# Patient Record
Sex: Male | Born: 1965 | Race: White | Hispanic: No | Marital: Married | Smoking: Never smoker
Health system: Southern US, Community
[De-identification: ages and names within clinical notes are randomized; demographics above are authoritative.]

## PROBLEM LIST (undated history)

## (undated) DIAGNOSIS — Q282 Arteriovenous malformation of cerebral vessels: Secondary | ICD-10-CM

## (undated) DIAGNOSIS — G43909 Migraine, unspecified, not intractable, without status migrainosus: Secondary | ICD-10-CM

## (undated) DIAGNOSIS — G40309 Generalized idiopathic epilepsy and epileptic syndromes, not intractable, without status epilepticus: Secondary | ICD-10-CM

## (undated) DIAGNOSIS — Z0000 Encounter for general adult medical examination without abnormal findings: Principal | ICD-10-CM

## (undated) DIAGNOSIS — R569 Unspecified convulsions: Secondary | ICD-10-CM

## (undated) HISTORY — DX: Arteriovenous malformation of cerebral vessels: Q28.2

## (undated) HISTORY — DX: Encounter for general adult medical examination without abnormal findings: Z00.00

## (undated) HISTORY — DX: Migraine, unspecified, not intractable, without status migrainosus: G43.909

## (undated) HISTORY — DX: Unspecified convulsions: R56.9

## (undated) HISTORY — DX: Generalized idiopathic epilepsy and epileptic syndromes, not intractable, without status epilepticus: G40.309

---

## 2001-01-08 ENCOUNTER — Encounter: Payer: Self-pay | Admitting: Family Medicine

## 2001-01-08 ENCOUNTER — Encounter: Admission: RE | Admit: 2001-01-08 | Discharge: 2001-01-08 | Payer: Self-pay | Admitting: Family Medicine

## 2001-01-14 ENCOUNTER — Ambulatory Visit (HOSPITAL_COMMUNITY): Admission: RE | Admit: 2001-01-14 | Discharge: 2001-01-14 | Payer: Self-pay | Admitting: Neurological Surgery

## 2001-01-14 ENCOUNTER — Encounter: Payer: Self-pay | Admitting: Neurological Surgery

## 2001-01-16 ENCOUNTER — Ambulatory Visit (HOSPITAL_COMMUNITY): Admission: RE | Admit: 2001-01-16 | Discharge: 2001-01-16 | Payer: Self-pay | Admitting: Neurological Surgery

## 2001-01-21 ENCOUNTER — Ambulatory Visit (HOSPITAL_COMMUNITY): Admission: RE | Admit: 2001-01-21 | Discharge: 2001-01-21 | Payer: Self-pay | Admitting: Neurological Surgery

## 2001-01-21 ENCOUNTER — Encounter: Payer: Self-pay | Admitting: Neurological Surgery

## 2006-02-12 ENCOUNTER — Emergency Department (HOSPITAL_COMMUNITY): Admission: EM | Admit: 2006-02-12 | Discharge: 2006-02-12 | Payer: Self-pay | Admitting: Emergency Medicine

## 2007-02-12 ENCOUNTER — Encounter: Admission: RE | Admit: 2007-02-12 | Discharge: 2007-02-12 | Payer: Self-pay | Admitting: Unknown Physician Specialty

## 2013-03-19 ENCOUNTER — Other Ambulatory Visit: Payer: Self-pay | Admitting: Neurology

## 2013-03-24 ENCOUNTER — Telehealth: Payer: Self-pay | Admitting: Neurology

## 2013-03-24 NOTE — Telephone Encounter (Signed)
Patient has not been seen since 2012.  He will need to schedule an appt.  I spoke with patient, he is aware.

## 2013-03-25 ENCOUNTER — Telehealth: Payer: Self-pay | Admitting: Neurology

## 2013-03-25 MED ORDER — LAMICTAL 200 MG PO TABS: 200.0000 mg | ORAL_TABLET | Freq: Two times a day (BID) | ORAL | Status: DC

## 2013-03-25 MED ORDER — LEVETIRACETAM 750 MG PO TABS: 750.0000 mg | ORAL_TABLET | Freq: Two times a day (BID) | ORAL | Status: DC

## 2013-03-25 MED ORDER — LAMICTAL 150 MG PO TABS: 150.0000 mg | ORAL_TABLET | Freq: Two times a day (BID) | ORAL | Status: DC

## 2013-03-25 NOTE — Telephone Encounter (Signed)
Called pt. Returned call to number left, unable to accept calls at this time. Tried alternate numbers. Unsuccessful.

## 2013-03-25 NOTE — Telephone Encounter (Signed)
Patient is calling to let us know he is out of his medication.  Please call back today if at all possible.  He has left more than one message, per patient.

## 2013-03-25 NOTE — Telephone Encounter (Signed)
I spoke with patient.  He would like Rx's sent to [facility].

## 2013-10-01 ENCOUNTER — Encounter (INDEPENDENT_AMBULATORY_CARE_PROVIDER_SITE_OTHER): Payer: Self-pay

## 2013-10-01 ENCOUNTER — Ambulatory Visit (INDEPENDENT_AMBULATORY_CARE_PROVIDER_SITE_OTHER): Payer: Managed Care, Other (non HMO) | Admitting: Neurology

## 2013-10-01 ENCOUNTER — Encounter: Payer: Self-pay | Admitting: Neurology

## 2013-10-01 VITALS — BP 136/69 | HR 67 | Wt 187.5 lb

## 2013-10-01 DIAGNOSIS — Q282 Arteriovenous malformation of cerebral vessels: Secondary | ICD-10-CM

## 2013-10-01 DIAGNOSIS — Z5181 Encounter for therapeutic drug level monitoring: Secondary | ICD-10-CM

## 2013-10-01 DIAGNOSIS — Q283 Other malformations of cerebral vessels: Secondary | ICD-10-CM

## 2013-10-01 DIAGNOSIS — G40309 Generalized idiopathic epilepsy and epileptic syndromes, not intractable, without status epilepticus: Secondary | ICD-10-CM | POA: Insufficient documentation

## 2013-10-01 HISTORY — DX: Generalized idiopathic epilepsy and epileptic syndromes, not intractable, without status epilepticus: G40.309

## 2013-10-01 MED ORDER — LAMICTAL 150 MG PO TABS: 150.0000 mg | ORAL_TABLET | Freq: Two times a day (BID) | ORAL | Status: DC

## 2013-10-01 MED ORDER — LEVETIRACETAM 750 MG PO TABS: 750.0000 mg | ORAL_TABLET | Freq: Two times a day (BID) | ORAL | Status: DC

## 2013-10-01 MED ORDER — LAMICTAL 200 MG PO TABS: 200.0000 mg | ORAL_TABLET | Freq: Two times a day (BID) | ORAL | Status: DC

## 2013-10-01 MED ORDER — RIZATRIPTAN BENZOATE 10 MG PO TBDP: 10.0000 mg | ORAL_TABLET | Freq: Three times a day (TID) | ORAL | Status: DC | PRN

## 2013-10-01 NOTE — Patient Instructions (Signed)
Epilepsy A seizure (convulsion) is a sudden change in brain function that causes a change in behavior, muscle activity, or ability to remain awake and alert. If a person has recurring seizures, this is called epilepsy. CAUSES  Epilepsy is a disorder with many possible causes. Anything that disturbs the normal pattern of brain cell activity can lead to seizures. Seizure can be caused from illness to brain damage to abnormal brain development. Epilepsy may develop because of:  An abnormality in brain wiring.  An imbalance of nerve signaling chemicals (neurotransmitters).  Some combination of these factors. Scientists are learning an increasing amount about genetic causes of seizures. SYMPTOMS  The symptoms of a seizure can vary greatly from one person to another. These may include:  An aura, or warning that tells a person they are about to have a seizure.  Abnormal sensations, such as abnormal smell or seeing flashing lights.  Sudden, general body stiffness.  Rhythmic jerking of the face, arm, or leg  on one or both sides.  Sudden change in consciousness.  The person may appear to be awake but not responding.  They may appear to be asleep but cannot be awakened.  Grimacing, chewing, lip smacking, or drooling.  Often there is a period of sleepiness after a seizure. DIAGNOSIS  The description you give to your caregiver about what you experienced will help them understand your problems. Equally important is the description by any witnesses to your seizure. A physical exam, including a detailed neurological exam, is necessary. An EEG (electroencephalogram) is a painless test of your brain waves. In this test a diagram is created of your brain waves. These diagrams can be interpreted by a specialist. Pictures of your brain are usually taken with:  An MRI.  A CT scan. Lab tests may be done to look for:  Signs of infection.  Abnormal blood chemistry. PREVENTION  There is no way to  prevent the development of epilepsy. If you have seizures that are typically triggered by an event (such as flashing lights), try to avoid the trigger. This can help you avoid a seizure.  PROGNOSIS  Most people with epilepsy lead outwardly normal lives. While epilepsy cannot currently be cured, for some people it does eventually go away. Most seizures do not cause brain damage. It is not uncommon for people with epilepsy, especially children, to develop behavioral and emotional problems. These problems are sometimes the consequence of medicine for seizures or social stress. For some people with epilepsy, the risk of seizures restricts their independence and recreational activities. For example, some states refuse drivers licenses to people with epilepsy. Most women with epilepsy can become pregnant. They should discuss their epilepsy and the medicine they are taking with their caregivers. Women with epilepsy have a 90 percent or better chance of having a normal, healthy baby. RISKS AND COMPLICATIONS  People with epilepsy are at increased risk of falls, accidents, and injuries. People with epilepsy are at special risk for two life-threatening conditions. These are status epilepticus and sudden unexplained death (extremely rare). Status epilepticus is a long lasting, continuous seizure that is a medical emergency. TREATMENT  Once epilepsy is diagnosed, it is important to begin treatment as soon as possible. For about 80 percent of those diagnosed with epilepsy, seizures can be controlled with modern medicines and surgical techniques. Some antiepileptic drugs can interfere with the effectiveness of oral contraceptives. In 1997, the FDA approved a pacemaker for the brain the (vagus nerve stimulator). This stimulator can be used for   people with seizures that are not well-controlled by medicine. Studies have shown that in some cases, children may experience fewer seizures if they maintain a strict diet. The strict  diet is called the ketogenic diet. This diet is rich in fats and low in carbohydrates. HOME CARE INSTRUCTIONS   Your caregiver will make recommendations about driving and safety in normal activities. Follow these carefully.  Take any medicine prescribed exactly as directed.  Do any blood tests requested to monitor the levels of your medicine.  The people you live and work with should know that you are prone to seizures. They should receive instructions on how to help you. In general, a witness to a seizure should:  Cushion your head and body.  Turn you on your side.  Avoid unnecessarily restraining you.  Not place anything inside your mouth.  Call for local emergency medical help if there is any question about what has occurred.  Keep a seizure diary. Record what you recall about any seizure, especially any possible trigger.  If your caregiver has given you a follow-up appointment, it is very important to keep that appointment. Not keeping the appointment could result in permanent injury and disability. If there is any problem keeping the appointment, you must call back to this facility for assistance. SEEK MEDICAL CARE IF:   You develop signs of infection or other illness. This might increase the risk of a seizure.  You seem to be having more frequent seizures.  Your seizure pattern is changing. SEEK IMMEDIATE MEDICAL CARE IF:   A seizure does not stop after a few moments.  A seizure causes any difficulty in breathing.  A seizure results in a very severe headache.  A seizure leaves you with the inability to speak or use a part of your body. MAKE SURE YOU:   Understand these instructions.  Will watch your condition.  Will get help right away if you are not doing well or get worse. Document Released: 10/02/2005 Document Revised: 12/25/2011 Document Reviewed: 05/14/2013 Exit[employer] Patient Information 2014 [employer].  

## 2013-10-01 NOTE — Progress Notes (Signed)
Reason for visit: Seizures  Cristian [name] is an 47 y.o. 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 This has resulted in a seizure disorder, but the patient is well controlled on Lamictal and Keppra combination. Since being on Keppra, the patient has noted significant muscle soreness after exercise. The patient however, is able to maintain a fairly good level of physical activity. The patient is operating motor vehicle, and he continues to work. The patient has been seen previously by Dr. Danielle Dess for the AVM. The last CT scan of the brain was done in 2007. The patient still not to be a surgical candidate, and no other therapies have been done for the AVM. The patient returns to this office for an evaluation. The patient reports no new numbness, weakness, vision changes, or memory problems. The patient does have a history of migraine headaches that occur once every 2 or 3 months. The patient takes Maxalt for this with good benefit.   Past Medical History  Diagnosis Date  . Seizure   . Cerebral AVM     Right occipito-temporal  . Migraine   . 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 Rotator cuff repair Left     Family History  Problem Relation Age of Onset  . Seizures Neg Hx     Social history:  reports that he has never smoked. He has never used smokeless tobacco. He reports that he drinks alcohol. He reports that he does not use illicit drugs.   No Known Allergies  Medications:  No current outpatient prescriptions on file prior to visit.   No current facility-administered medications on file prior to visit.    ROS:  Out of a complete 14 system review of symptoms, the patient complains only of the following symptoms, and all other reviewed systems  are negative.  Neck pain Headache Seizures  Blood pressure 136/69, pulse 67, weight 187 lb 8 oz (85.049 kg).  Physical Exam  General: The patient is alert and cooperative at the time of the examination.  Skin: No significant peripheral edema is noted.   Neurologic Exam  Mental status: The patient is oriented x 3.  Cranial nerves: Facial symmetry is present. Speech is normal, no aphasia or dysarthria is noted. Extraocular movements are full. Visual fields are full.  Motor: The patient has good strength in all 4 extremities.  Sensory examination: Soft touch sensation on the face, arms, and legs is symmetric.  Coordination: The patient has good finger-nose-finger and heel-to-shin bilaterally.  Gait and station: The patient has a normal gait. Tandem gait is normal. Romberg is negative. No drift is seen.  Reflexes: Deep tendon reflexes are symmetric.   Assessment/Plan:  One. History seizures  2. Right occipital and temporal AVM  3. Migraine headache  The patient has not had an evaluation for his AVM several years, the last CT scan of the head was done in 2007. I will repeat a CT angiogram of the head to look at the AVM. The patient was given prescriptions for his medications, and blood work will be done today. The patient does not have a primary care physician. The patient will followup in one year.  Marlan Palau MD 10/01/2013 8:43 AM  [facility] [address] [place], [place] [address]  Phone [phone] Fax [phone]  

## 2013-10-02 ENCOUNTER — Telehealth: Payer: Self-pay | Admitting: Neurology

## 2013-10-02 NOTE — Telephone Encounter (Signed)
I called back.  Verified Rx with [facility].  They will process order.

## 2013-10-03 ENCOUNTER — Telehealth: Payer: Self-pay | Admitting: *Deleted

## 2013-10-03 LAB — COMPREHENSIVE METABOLIC PANEL
ALT: 27 IU/L (ref 0–44)
AST: 26 IU/L (ref 0–40)
Albumin/Globulin Ratio: 2.3 (ref 1.1–2.5)
Albumin: 4.9 g/dL (ref 3.5–5.5)
BUN: 13 mg/dL (ref 6–24)
CO2: 24 mmol/L (ref 18–29)
Calcium: 10 mg/dL (ref 8.7–10.2)
Chloride: 100 mmol/L (ref 97–108)
GFR calc non Af Amer: 102 mL/min/{1.73_m2} (ref >59)
Glucose: 91 mg/dL (ref 65–99)
Potassium: 4.2 mmol/L (ref 3.5–5.2)
Sodium: 143 mmol/L (ref 134–144)
Total Protein: 7 g/dL (ref 6.0–8.5)

## 2013-10-03 LAB — CBC WITH DIFFERENTIAL
Basos: 0 %
Eos: 2 %
HCT: 44.1 % (ref 37.5–51.0)
Hemoglobin: 14.7 g/dL (ref 12.6–17.7)
Lymphs: 36 %
MCHC: 33.3 g/dL (ref 31.5–35.7)
Monocytes: 9 %
Neutrophils Absolute: 2.4 10*3/uL (ref 1.4–7.0)
RBC: 4.68 x10E6/uL (ref 4.14–5.80)
WBC: 4.6 10*3/uL (ref 3.4–10.8)

## 2013-10-03 LAB — LAMOTRIGINE LEVEL: Lamotrigine Lvl: 10.8 ug/mL (ref 2.0–20.0)

## 2013-10-03 NOTE — Telephone Encounter (Signed)
Message copied by Salome Spotted on Fri Oct 03, 2013  1:11 PM ------      Message from: Stephanie Acre      Created: Fri Oct 03, 2013  7:50 AM       Please call the patient. The blood work results are unremarkable. Thank you.      Good levels of lamotrigine, no dosage adjustment needed.            ----- Message -----         From: [facility] Lab Results In Interface         Sent: 10/03/2013   5:47 AM           To: York Spaniel, MD                   ------

## 2013-10-03 NOTE — Telephone Encounter (Signed)
Called patient and relayed his results.Marland Kitchen

## 2013-10-05 ENCOUNTER — Other Ambulatory Visit: Payer: Self-pay

## 2013-10-05 MED ORDER — RIZATRIPTAN BENZOATE 10 MG PO TBDP: 10.0000 mg | ORAL_TABLET | ORAL | Status: DC | PRN

## 2013-10-27 ENCOUNTER — Ambulatory Visit
Admission: RE | Admit: 2013-10-27 | Discharge: 2013-10-27 | Disposition: A | Discharge status: Home / Self Care | Payer: 59 | Source: Ambulatory Visit | Attending: Neurology | Admitting: Neurology

## 2013-10-27 ENCOUNTER — Telehealth: Payer: Self-pay | Admitting: Neurology

## 2013-10-27 DIAGNOSIS — Q282 Arteriovenous malformation of cerebral vessels: Secondary | ICD-10-CM

## 2013-10-27 DIAGNOSIS — G40309 Generalized idiopathic epilepsy and epileptic syndromes, not intractable, without status epilepticus: Secondary | ICD-10-CM

## 2013-10-27 MED ORDER — IOHEXOL 350 MG/ML SOLN
100.0000 mL | Freq: Once | INTRAVENOUS | Status: AC | PRN
  Administered 2013-10-27: 100 mL via INTRAVENOUS

## 2013-10-27 NOTE — Telephone Encounter (Signed)
I called patient. The CT angiogram of the head shows no change from April 2007. The AVM is quite large, involving a good portion of the right hemisphere of the brain.

## 2014-05-13 ENCOUNTER — Encounter: Payer: Self-pay | Admitting: Neurology

## 2014-06-19 ENCOUNTER — Encounter: Payer: Self-pay | Admitting: Neurology

## 2014-08-13 ENCOUNTER — Telehealth: Payer: Self-pay | Admitting: Neurology

## 2014-08-13 MED ORDER — LEVETIRACETAM 750 MG PO TABS: 750.0000 mg | ORAL_TABLET | Freq: Two times a day (BID) | ORAL | Status: DC

## 2014-08-13 MED ORDER — LAMICTAL 150 MG PO TABS: 150.0000 mg | ORAL_TABLET | Freq: Two times a day (BID) | ORAL | Status: DC

## 2014-08-13 MED ORDER — LAMICTAL 200 MG PO TABS: 200.0000 mg | ORAL_TABLET | Freq: Two times a day (BID) | ORAL | Status: DC

## 2014-08-13 NOTE — Telephone Encounter (Signed)
Rx's have been sent.  Patient has appt scheduled in Jan

## 2014-08-13 NOTE — Telephone Encounter (Signed)
Patient is calling to get a refill for Lamictal 150 and 200mg (brand name)and generic Keppra. Please call to [facility]. Thank you

## 2014-10-01 ENCOUNTER — Ambulatory Visit: Payer: Managed Care, Other (non HMO) | Admitting: Neurology

## 2014-10-02 ENCOUNTER — Encounter: Payer: Self-pay | Admitting: Family Medicine

## 2014-10-02 ENCOUNTER — Ambulatory Visit (INDEPENDENT_AMBULATORY_CARE_PROVIDER_SITE_OTHER): Payer: 59 | Admitting: Family Medicine

## 2014-10-02 VITALS — BP 115/50 | HR 56 | Temp 98.1°F | Ht 73.5 in | Wt 190.8 lb

## 2014-10-02 DIAGNOSIS — Q283 Other malformations of cerebral vessels: Secondary | ICD-10-CM

## 2014-10-02 DIAGNOSIS — Z1211 Encounter for screening for malignant neoplasm of colon: Secondary | ICD-10-CM

## 2014-10-02 DIAGNOSIS — Z0000 Encounter for general adult medical examination without abnormal findings: Secondary | ICD-10-CM

## 2014-10-02 DIAGNOSIS — Q282 Arteriovenous malformation of cerebral vessels: Secondary | ICD-10-CM

## 2014-10-02 DIAGNOSIS — G40309 Generalized idiopathic epilepsy and epileptic syndromes, not intractable, without status epilepticus: Secondary | ICD-10-CM

## 2014-10-02 LAB — HEPATIC FUNCTION PANEL
ALBUMIN: 4.5 g/dL (ref 3.5–5.2)
ALK PHOS: 45 U/L (ref 39–117)
ALT: 23 U/L (ref 0–53)
AST: 23 U/L (ref 0–37)
BILIRUBIN TOTAL: 0.6 mg/dL (ref 0.2–1.2)
Bilirubin, Direct: 0.2 mg/dL (ref 0.0–0.3)
Indirect Bilirubin: 0.4 mg/dL (ref 0.2–1.2)
TOTAL PROTEIN: 6.4 g/dL (ref 6.0–8.3)

## 2014-10-02 LAB — RENAL FUNCTION PANEL
Albumin: 4.5 g/dL (ref 3.5–5.2)
BUN: 17 mg/dL (ref 6–23)
CHLORIDE: 102 meq/L (ref 96–112)
CO2: 28 meq/L (ref 19–32)
CREATININE: 0.9 mg/dL (ref 0.50–1.35)
Calcium: 9.2 mg/dL (ref 8.4–10.5)
Glucose, Bld: 88 mg/dL (ref 70–99)
PHOSPHORUS: 2.8 mg/dL (ref 2.3–4.6)
POTASSIUM: 3.8 meq/L (ref 3.5–5.3)
Sodium: 141 mEq/L (ref 135–145)

## 2014-10-02 LAB — CBC
HEMATOCRIT: 40.4 % (ref 39.0–52.0)
Hemoglobin: 13.9 g/dL (ref 13.0–17.0)
MCH: 31.4 pg (ref 26.0–34.0)
MCHC: 34.4 g/dL (ref 30.0–36.0)
MCV: 91.4 fL (ref 78.0–100.0)
MPV: 9.3 fL — AB (ref 9.4–12.4)
Platelets: 169 10*3/uL (ref 150–400)
RBC: 4.42 MIL/uL (ref 4.22–5.81)
RDW: 13 % (ref 11.5–15.5)
WBC: 4.8 10*3/uL (ref 4.0–10.5)

## 2014-10-02 LAB — LIPID PANEL
CHOL/HDL RATIO: 2.9 ratio
Cholesterol: 132 mg/dL (ref 0–200)
HDL: 46 mg/dL (ref >39)
LDL Cholesterol: 71 mg/dL (ref 0–99)
TRIGLYCERIDES: 77 mg/dL (ref <150)
VLDL: 15 mg/dL (ref 0–40)

## 2014-10-02 LAB — TSH: TSH: 1.223 u[IU]/mL (ref 0.350–4.500)

## 2014-10-02 MED ORDER — TRIAMCINOLONE ACETONIDE 0.1 % EX CREA: 1.0000 "application " | TOPICAL_CREAM | Freq: Two times a day (BID) | CUTANEOUS | Status: DC

## 2014-10-02 NOTE — Progress Notes (Signed)
Pre visit review using our clinic review tool, if applicable. No additional management support is needed unless otherwise documented below in the visit note. 

## 2014-10-02 NOTE — Progress Notes (Signed)
Cristian PoseyDennis Scott [name] [id number] 03/26/1966 10/02/2014      Progress Note New Patient  Subjective  Chief Complaint  Chief Complaint  Patient presents with  . Establish Care    HPI  Patient is a 48 year old male in today for routine medical care. Patient is in today to establish care. He has a history of a brain AV malformation and seizures as a result. Follows closely with [facility] and is doing well. No recent episodes. He travels internationally for his work at [employer]. He has had no recent illness. He eats a heart healthy diet and exercises regularly.  Past Medical History  Diagnosis Date  . Seizure   . Cerebral AVM     Right occipito-temporal  . Migraine   . 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 Rotator cuff repair Left     Family History  Problem Relation Age of Onset  . Seizures Neg Hx   . Stroke Paternal Grandfather     History   Social History  . Marital Status: Married    Spouse Name: N/A    Number of Children: 1  . Years of Education: PhD   Occupational History  .  [employer]   Social History Main Topics  . Smoking status: Never Smoker   . Smokeless tobacco: Never Used  . Alcohol Use: Yes     Comment: 1 beverage daily  . Drug Use: No  . Sexual Activity: Yes     Comment: lives with wife and daughter, dog. no major dietary restrictions. 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 Not on file   Social History Narrative    Current Outpatient Prescriptions on File Prior to Visit  Medication Sig Dispense Refill  . LAMICTAL 150 MG tablet Take 1 tablet (150 mg total) by mouth 2 (two) times daily. 180 tablet 0  . LAMICTAL 200 MG tablet Take 1 tablet (200 mg total) by mouth 2 (two) times daily. 180 tablet 0  . levETIRAcetam (KEPPRA) 750 MG tablet Take 1 tablet (750 mg total) by mouth 2 (two) times  daily. 180 tablet 0  . rizatriptan (MAXALT-MLT) 10 MG disintegrating tablet Take 1 tablet (10 mg total) by mouth as needed for migraine. May repeat in 2 hours if needed 27 tablet 3   No current facility-administered medications on file prior to visit.    No Known Allergies  Review of Systems  Review of Systems  Constitutional: Negative for fever, chills and malaise/fatigue.  HENT: Negative for congestion, hearing loss and nosebleeds.   Eyes: Negative for discharge.  Respiratory: Negative for cough, sputum production, shortness of breath and wheezing.   Cardiovascular: Negative for chest pain, palpitations and leg swelling.  Gastrointestinal: Negative for heartburn, nausea, vomiting, abdominal pain, diarrhea, constipation and blood in stool.  Genitourinary: Negative for dysuria, urgency, frequency and hematuria.  Musculoskeletal: Negative for myalgias, back pain and falls.  Skin: Negative for rash.  Neurological: Negative for dizziness, tremors, sensory change, focal weakness, loss of consciousness, weakness and headaches.  Endo/Heme/Allergies: Negative for polydipsia. Does not bruise/bleed easily.  Psychiatric/Behavioral: Negative for depression and suicidal ideas. The patient is not nervous/anxious and does not have insomnia.     Objective  BP 115/50 mmHg  Pulse 56  Temp(Src) 98.1 F (36.7 C) (Oral)  Ht 6' 1.5" (1.867 m)  Wt 190 lb 12.8 oz (86.546 kg)  BMI 24.83 kg/m2  SpO2 99%  Physical Exam  Physical Exam  Constitutional: He is oriented to person, place, and time and well-developed, well-nourished, and in no distress. No distress.  HENT:  Head: Normocephalic and atraumatic.  Eyes: Conjunctivae are normal.  Neck: Neck supple. No thyromegaly present.  Cardiovascular: Normal rate, regular rhythm and normal heart sounds.   No murmur heard. Pulmonary/Chest: Effort normal and breath sounds normal. No respiratory distress.  Abdominal: He exhibits no distension and no mass.  There is no tenderness.  Musculoskeletal: He exhibits no edema.  Neurological: He is alert and oriented to person, place, and time.  Skin: Skin is warm.  Psychiatric: Memory, affect and judgment normal.       Assessment & Plan  AVM (arteriovenous malformation) brain Monitored with imaging annually  Preventative health care Patient encouraged to maintain heart healthy diet, regular exercise, adequate sleep. Consider daily probiotics. Take medications as prescribed. [facility] [place]. Last Tetanus shot 5 years ago. He travels internationally, is encouraged to check with his insurance to see if they will pay for Zostavax to prevent outbreak when he is in remote area. Labs reviewed. Referred for screening colonoscopy  Generalized convulsive epilepsy No recent activity, tolerating current meds, following with [facility]

## 2014-10-02 NOTE — Patient Instructions (Addendum)
Consider a Shingles shot/Zostavax call insurance to see if they pay for this in the 68s   Preventive Care for Adults A healthy lifestyle and preventive care can promote health and wellness. Preventive health guidelines for men include the following key practices:  A routine yearly physical is a good way to check with your health care provider about your health and preventative screening. It is a chance to share any concerns and updates on your health and to receive a thorough exam.  Visit your dentist for a routine exam and preventative care every 6 months. Brush your teeth twice a day and floss once a day. Good oral hygiene prevents tooth decay and gum disease.  The frequency of eye exams is based on your age, health, family medical history, use of contact lenses, and other factors. Follow your health care provider's recommendations for frequency of eye exams.  Eat a healthy diet. Foods such as vegetables, fruits, whole grains, low-fat dairy products, and lean protein foods contain the nutrients you need without too many calories. Decrease your intake of foods high in solid fats, added sugars, and salt. Eat the right amount of calories for you.Get information about a proper diet from your health care provider, if necessary.  Regular physical exercise is one of the most important things you can do for your health. Most adults should get at least 150 minutes of moderate-intensity exercise (any activity that increases your heart rate and causes you to sweat) each week. In addition, most adults need muscle-strengthening exercises on 2 or more days a week.  Maintain a healthy weight. The body mass index (BMI) is a screening tool to identify possible weight problems. It provides an estimate of body fat based on height and weight. Your health care provider can find your BMI and can help you achieve or maintain a healthy weight.For adults 20 years and older:  A BMI below 18.5 is considered  underweight.  A BMI of 18.5 to 24.9 is normal.  A BMI of 25 to 29.9 is considered overweight.  A BMI of 30 and above is considered obese.  Maintain normal blood lipids and cholesterol levels by exercising and minimizing your intake of saturated fat. Eat a balanced diet with plenty of fruit and vegetables. Blood tests for lipids and cholesterol should begin at age 54 and be repeated every 5 years. If your lipid or cholesterol levels are high, you are over 50, or you are at high risk for heart disease, you may need your cholesterol levels checked more frequently.Ongoing high lipid and cholesterol levels should be treated with medicines if diet and exercise are not working.  If you smoke, find out from your health care provider how to quit. If you do not use tobacco, do not start.  Lung cancer screening is recommended for adults aged 19-80 years who are at high risk for developing lung cancer because of a history of smoking. A yearly low-dose CT scan of the lungs is recommended for people who have at least a 30-pack-year history of smoking and are a current smoker or have quit within the past 15 years. A pack year of smoking is smoking an average of 1 pack of cigarettes a day for 1 year (for example: 1 pack a day for 30 years or 2 packs a day for 15 years). Yearly screening should continue until the smoker has stopped smoking for at least 15 years. Yearly screening should be stopped for people who develop a health problem that would prevent  them from having lung cancer treatment.  If you choose to drink alcohol, do not have more than 2 drinks per day. One drink is considered to be 12 ounces (355 mL) of beer, 5 ounces (148 mL) of wine, or 1.5 ounces (44 mL) of liquor.  Avoid use of street drugs. Do not share needles with anyone. Ask for help if you need support or instructions about stopping the use of drugs.  High blood pressure causes heart disease and increases the risk of stroke. Your blood  pressure should be checked at least every 1-2 years. Ongoing high blood pressure should be treated with medicines, if weight loss and exercise are not effective.  If you are 47-56 years old, ask your health care provider if you should take aspirin to prevent heart disease.  Diabetes screening involves taking a blood sample to check your fasting blood sugar level. This should be done once every 3 years, after age 75, if you are within normal weight and without risk factors for diabetes. Testing should be considered at a younger age or be carried out more frequently if you are overweight and have at least 1 risk factor for diabetes.  Colorectal cancer can be detected and often prevented. Most routine colorectal cancer screening begins at the age of 49 and continues through age 62. However, your health care provider may recommend screening at an earlier age if you have risk factors for colon cancer. On a yearly basis, your health care provider may provide home test kits to check for hidden blood in the stool. Use of a small camera at the end of a tube to directly examine the colon (sigmoidoscopy or colonoscopy) can detect the earliest forms of colorectal cancer. Talk to your health care provider about this at age 66, when routine screening begins. Direct exam of the colon should be repeated every 5-10 years through age 25, unless early forms of precancerous polyps or small growths are found.  People who are at an increased risk for hepatitis B should be screened for this virus. You are considered at high risk for hepatitis B if:  You were born in a country where hepatitis B occurs often. Talk with your health care provider about which countries are considered high risk.  Your parents were born in a high-risk country and you have not received a shot to protect against hepatitis B (hepatitis B vaccine).  You have HIV or AIDS.  You use needles to inject street drugs.  You live with, or have sex with,  someone who has hepatitis B.  You are a man who has sex with other men (MSM).  You get hemodialysis treatment.  You take certain medicines for conditions such as cancer, organ transplantation, and autoimmune conditions.  Hepatitis C blood testing is recommended for all people born from [birth year] through 1965 and any individual with known risks for hepatitis C.  Practice safe sex. Use condoms and avoid high-risk sexual practices to reduce the spread of sexually transmitted infections (STIs). STIs include gonorrhea, chlamydia, syphilis, trichomonas, herpes, HPV, and human immunodeficiency virus (HIV). Herpes, HIV, and HPV are viral illnesses that have no cure. They can result in disability, cancer, and death.  If you are at risk of being infected with HIV, it is recommended that you take a prescription medicine daily to prevent HIV infection. This is called preexposure prophylaxis (PrEP). You are considered at risk if:  You are a man who has sex with other men (MSM) and have other risk  factors.  You are a heterosexual man, are sexually active, and are at increased risk for HIV infection.  You take drugs by injection.  You are sexually active with a partner who has HIV.  Talk with your health care provider about whether you are at high risk of being infected with HIV. If you choose to begin PrEP, you should first be tested for HIV. You should then be tested every 3 months for as long as you are taking PrEP.  A one-time screening for abdominal aortic aneurysm (AAA) and surgical repair of large AAAs by ultrasound are recommended for men ages 101 to 77 years who are current or former smokers.  Healthy men should no longer receive prostate-specific antigen (PSA) blood tests as part of routine cancer screening. Talk with your health care provider about prostate cancer screening.  Testicular cancer screening is not recommended for adult males who have no symptoms. Screening includes self-exam, a health  care provider exam, and other screening tests. Consult with your health care provider about any symptoms you have or any concerns you have about testicular cancer.  Use sunscreen. Apply sunscreen liberally and repeatedly throughout the day. You should seek shade when your shadow is shorter than you. Protect yourself by wearing long sleeves, pants, a wide-brimmed hat, and sunglasses year round, whenever you are outdoors.  Once a month, do a whole-body skin exam, using a mirror to look at the skin on your back. Tell your health care provider about new moles, moles that have irregular borders, moles that are larger than a pencil eraser, or moles that have changed in shape or color.  Stay current with required vaccines (immunizations).  Influenza vaccine. All adults should be immunized every year.  Tetanus, diphtheria, and acellular pertussis (Td, Tdap) vaccine. An adult who has not previously received Tdap or who does not know his vaccine status should receive 1 dose of Tdap. This initial dose should be followed by tetanus and diphtheria toxoids (Td) booster doses every 10 years. Adults with an unknown or incomplete history of completing a 3-dose immunization series with Td-containing vaccines should begin or complete a primary immunization series including a Tdap dose. Adults should receive a Td booster every 10 years.  Varicella vaccine. An adult without evidence of immunity to varicella should receive 2 doses or a second dose if he has previously received 1 dose.  Human papillomavirus (HPV) vaccine. Males aged 79-21 years who have not received the vaccine previously should receive the 3-dose series. Males aged 22-26 years may be immunized. Immunization is recommended through the age of 58 years for any male who has sex with males and did not get any or all doses earlier. Immunization is recommended for any person with an immunocompromised condition through the age of 40 years if he did not get any or  all doses earlier. During the 3-dose series, the second dose should be obtained 4-8 weeks after the first dose. The third dose should be obtained 24 weeks after the first dose and 16 weeks after the second dose.  Zoster vaccine. One dose is recommended for adults aged 67 years or older unless certain conditions are present.  Measles, mumps, and rubella (MMR) vaccine. Adults born before [birth year] generally are considered immune to measles and mumps. Adults born in [birth year] or later should have 1 or more doses of MMR vaccine unless there is a contraindication to the vaccine or there is laboratory evidence of immunity to each of the three diseases. A routine second  dose of MMR vaccine should be obtained at least 28 days after the first dose for students attending postsecondary schools, health care workers, or international travelers. People who received inactivated measles vaccine or an unknown type of measles vaccine during 1963-1967 should receive 2 doses of MMR vaccine. People who received inactivated mumps vaccine or an unknown type of mumps vaccine before 1979 and are at high risk for mumps infection should consider immunization with 2 doses of MMR vaccine. Unvaccinated health care workers born before [birth year] who lack laboratory evidence of measles, mumps, or rubella immunity or laboratory confirmation of disease should consider measles and mumps immunization with 2 doses of MMR vaccine or rubella immunization with 1 dose of MMR vaccine.  Pneumococcal 13-valent conjugate (PCV13) vaccine. When indicated, a person who is uncertain of his immunization history and has no record of immunization should receive the PCV13 vaccine. An adult aged 36 years or older who has certain medical conditions and has not been previously immunized should receive 1 dose of PCV13 vaccine. This PCV13 should be followed with a dose of pneumococcal polysaccharide (PPSV23) vaccine. The PPSV23 vaccine dose should be obtained at least 8 weeks after  the dose of PCV13 vaccine. An adult aged 6 years or older who has certain medical conditions and previously received 1 or more doses of PPSV23 vaccine should receive 1 dose of PCV13. The PCV13 vaccine dose should be obtained 1 or more years after the last PPSV23 vaccine dose.  Pneumococcal polysaccharide (PPSV23) vaccine. When PCV13 is also indicated, PCV13 should be obtained first. All adults aged 47 years and older should be immunized. An adult younger than age 72 years who has certain medical conditions should be immunized. Any person who resides in a nursing home or long-term care facility should be immunized. An adult smoker should be immunized. People with an immunocompromised condition and certain other conditions should receive both PCV13 and PPSV23 vaccines. People with human immunodeficiency virus (HIV) infection should be immunized as soon as possible after diagnosis. Immunization during chemotherapy or radiation therapy should be avoided. Routine use of PPSV23 vaccine is not recommended for American Indians, [place] Natives, or people younger than 65 years unless there are medical conditions that require PPSV23 vaccine. When indicated, people who have unknown immunization and have no record of immunization should receive PPSV23 vaccine. One-time revaccination 5 years after the first dose of PPSV23 is recommended for people aged 19-64 years who have chronic kidney failure, nephrotic syndrome, asplenia, or immunocompromised conditions. People who received 1-2 doses of PPSV23 before age 62 years should receive another dose of PPSV23 vaccine at age 63 years or later if at least 5 years have passed since the previous dose. Doses of PPSV23 are not needed for people immunized with PPSV23 at or after age 35 years.  Meningococcal vaccine. Adults with asplenia or persistent complement component deficiencies should receive 2 doses of quadrivalent meningococcal conjugate (MenACWY-D) vaccine. The doses should be  obtained at least 2 months apart. Microbiologists working with certain meningococcal bacteria, [place] recruits, people at risk during an outbreak, and people who travel to or live in countries with a high rate of meningitis should be immunized. A first-year college student up through age 59 years who is living in a residence hall should receive a dose if he did not receive a dose on or after his 16th birthday. Adults who have certain high-risk conditions should receive one or more doses of vaccine.  Hepatitis A vaccine. Adults who wish to be protected  from this disease, have certain high-risk conditions, work with hepatitis A-infected animals, work in hepatitis A research labs, or travel to or work in countries with a high rate of hepatitis A should be immunized. Adults who were previously unvaccinated and who anticipate close contact with an international adoptee during the first 60 days after arrival in the [place] from a country with a high rate of hepatitis A should be immunized.  Hepatitis B vaccine. Adults should be immunized if they wish to be protected from this disease, have certain high-risk conditions, may be exposed to blood or other infectious body fluids, are household contacts or sex partners of hepatitis B positive people, are clients or workers in certain care facilities, or travel to or work in countries with a high rate of hepatitis B.  Haemophilus influenzae type b (Hib) vaccine. A previously unvaccinated person with asplenia or sickle cell disease or having a scheduled splenectomy should receive 1 dose of Hib vaccine. Regardless of previous immunization, a recipient of a hematopoietic stem cell transplant should receive a 3-dose series 6-12 months after his successful transplant. Hib vaccine is not recommended for adults with HIV infection. Preventive Service / Frequency Ages 72 to 9  Blood pressure check.** / Every 1 to 2 years.  Lipid and cholesterol check.** / Every 5  years beginning at age 13.  Hepatitis C blood test.** / For any individual with known risks for hepatitis C.  Skin self-exam. / Monthly.  Influenza vaccine. / Every year.  Tetanus, diphtheria, and acellular pertussis (Tdap, Td) vaccine.** / Consult your health care provider. 1 dose of Td every 10 years.  Varicella vaccine.** / Consult your health care provider.  HPV vaccine. / 3 doses over 6 months, if 12 or younger.  Measles, mumps, rubella (MMR) vaccine.** / You need at least 1 dose of MMR if you were born in 1957 or later. You may also need a second dose.  Pneumococcal 13-valent conjugate (PCV13) vaccine.** / Consult your health care provider.  Pneumococcal polysaccharide (PPSV23) vaccine.** / 1 to 2 doses if you smoke cigarettes or if you have certain conditions.  Meningococcal vaccine.** / 1 dose if you are age 36 to 59 years and a Market researcher living in a residence hall, or have one of several medical conditions. You may also need additional booster doses.  Hepatitis A vaccine.** / Consult your health care provider.  Hepatitis B vaccine.** / Consult your health care provider.  Haemophilus influenzae type b (Hib) vaccine.** / Consult your health care provider. Ages 87 to 62  Blood pressure check.** / Every 1 to 2 years.  Lipid and cholesterol check.** / Every 5 years beginning at age 44.  Lung cancer screening. / Every year if you are aged 26-80 years and have a 30-pack-year history of smoking and currently smoke or have quit within the past 15 years. Yearly screening is stopped once you have quit smoking for at least 15 years or develop a health problem that would prevent you from having lung cancer treatment.  Fecal occult blood test (FOBT) of stool. / Every year beginning at age 87 and continuing until age 32. You may not have to do this test if you get a colonoscopy every 10 years.  Flexible sigmoidoscopy** or colonoscopy.** / Every 5 years for a flexible  sigmoidoscopy or every 10 years for a colonoscopy beginning at age 64 and continuing until age 27.  Hepatitis C blood test.** / For all people born from [birth year] through 1967  and any individual with known risks for hepatitis C.  Skin self-exam. / Monthly.  Influenza vaccine. / Every year.  Tetanus, diphtheria, and acellular pertussis (Tdap/Td) vaccine.** / Consult your health care provider. 1 dose of Td every 10 years.  Varicella vaccine.** / Consult your health care provider.  Zoster vaccine.** / 1 dose for adults aged 64 years or older.  Measles, mumps, rubella (MMR) vaccine.** / You need at least 1 dose of MMR if you were born in 1959 or later. You may also need a second dose.  Pneumococcal 13-valent conjugate (PCV13) vaccine.** / Consult your health care provider.  Pneumococcal polysaccharide (PPSV23) vaccine.** / 1 to 2 doses if you smoke cigarettes or if you have certain conditions.  Meningococcal vaccine.** / Consult your health care provider.  Hepatitis A vaccine.** / Consult your health care provider.  Hepatitis B vaccine.** / Consult your health care provider.  Haemophilus influenzae type b (Hib) vaccine.** / Consult your health care provider. Ages 83 and over  Blood pressure check.** / Every 1 to 2 years.  Lipid and cholesterol check.**/ Every 5 years beginning at age 40.  Lung cancer screening. / Every year if you are aged 69-80 years and have a 30-pack-year history of smoking and currently smoke or have quit within the past 15 years. Yearly screening is stopped once you have quit smoking for at least 15 years or develop a health problem that would prevent you from having lung cancer treatment.  Fecal occult blood test (FOBT) of stool. / Every year beginning at age 64 and continuing until age 12. You may not have to do this test if you get a colonoscopy every 10 years.  Flexible sigmoidoscopy** or colonoscopy.** / Every 5 years for a flexible sigmoidoscopy or every 10  years for a colonoscopy beginning at age 49 and continuing until age 53.  Hepatitis C blood test.** / For all people born from [birth year] through 1967 and any individual with known risks for hepatitis C.  Abdominal aortic aneurysm (AAA) screening.** / A one-time screening for ages 28 to 65 years who are current or former smokers.  Skin self-exam. / Monthly.  Influenza vaccine. / Every year.  Tetanus, diphtheria, and acellular pertussis (Tdap/Td) vaccine.** / 1 dose of Td every 10 years.  Varicella vaccine.** / Consult your health care provider.  Zoster vaccine.** / 1 dose for adults aged 68 years or older.  Pneumococcal 13-valent conjugate (PCV13) vaccine.** / Consult your health care provider.  Pneumococcal polysaccharide (PPSV23) vaccine.** / 1 dose for all adults aged 56 years and older.  Meningococcal vaccine.** / Consult your health care provider.  Hepatitis A vaccine.** / Consult your health care provider.  Hepatitis B vaccine.** / Consult your health care provider.  Haemophilus influenzae type b (Hib) vaccine.** / Consult your health care provider. **Family history and personal history of risk and conditions may change your health care provider's recommendations. Document Released: 11/28/2001 Document Revised: 10/07/2013 Document Reviewed: 02/27/2011 [facility] Patient Information 2015 [place], [place]. This information is not intended to replace advice given to you by your health care provider. Make sure you discuss any questions you have with your health care provider. Seborrheic Keratosis Seborrheic keratosis is a common, noncancerous (benign) skin growth that can occur anywhere on the skin.It looks like "stuck-on," waxy, rough, tan, brown, or black spots on the skin. These skin growths can be flat or raised.They are often called "barnacles" because of their pasted-on appearance.Usually, these skin growths appear in adulthood, around age 36, and  increase in number as you age. They  may also develop during pregnancy or following estrogen therapy. Many people may only have one growth appear in their lifetime, while some people may develop many growths. CAUSES It is unknown what causes these skin growths, but they appear to run in families. SYMPTOMS Seborrheic keratosis is often located on the face, chest, shoulders, back, or other areas. These growths are:  Usually painless, but may become irritated and itchy.  Yellow, brown, black, or other colors.  Slightly raised or have a flat surface.  Sometimes rough or wart-like in texture.  Often waxy on the surface.  Round or oval-shaped.  Sometimes "stuck-on" in appearance.  Sometimes single, but there are usually many growths. Any growth that bleeds, itches on a regular basis, becomes inflamed, or becomes irritated needs to be evaluated by a skin specialist (dermatologist). DIAGNOSIS Diagnosis is mainly based on the way the growths appear. In some cases, it can be difficult to tell this type of skin growth from skin cancer. A skin growth tissue sample (biopsy) may be used to confirm the diagnosis. TREATMENT Most often, treatment is not needed because the skin growths are benign.If the skin growth is irritated easily by clothing or jewelry, causing it to scab or bleed, treatment may be recommended. Patients may also choose to have the growths removed because they do not like their appearance. Most commonly, these growths are treated with cryosurgery. In cryosurgery, liquid nitrogen is applied to "freeze" the growth. The growth usually falls off within a matter of days. A blister may form and dry into a scab that will also fall off. After the growth or scab falls off, it may leave a dark or light spot on the skin. This color may fade over time, or it may remain permanent on the skin. HOME CARE INSTRUCTIONS If the skin growths are treated with cryosurgery, the treated area needs to be kept clean with water and soap. SEEK  MEDICAL CARE IF:  You have questions about these growths or other skin problems.  You develop new symptoms, including:  A change in the appearance of the skin growth.  New growths.  Any bleeding, itching, or pain in the growths.  A skin growth that looks similar to seborrheic keratosis. Document Released: 11/04/2010 Document Revised: 12/25/2011 Document Reviewed: 11/04/2010 [facility] Patient Information 2015 [place], [place]. This information is not intended to replace advice given to you by your health care provider. Make sure you discuss any questions you have with your health care provider.

## 2014-10-03 LAB — PSA: PSA: 0.77 ng/mL (ref <=4.00)

## 2014-10-05 ENCOUNTER — Encounter: Payer: Self-pay | Admitting: *Deleted

## 2014-10-05 ENCOUNTER — Encounter: Payer: Self-pay | Admitting: Family Medicine

## 2014-10-05 DIAGNOSIS — Z0000 Encounter for general adult medical examination without abnormal findings: Secondary | ICD-10-CM | POA: Insufficient documentation

## 2014-10-05 DIAGNOSIS — Q282 Arteriovenous malformation of cerebral vessels: Secondary | ICD-10-CM

## 2014-10-05 HISTORY — DX: Encounter for general adult medical examination without abnormal findings: Z00.00

## 2014-10-05 HISTORY — DX: Arteriovenous malformation of cerebral vessels: Q28.2

## 2014-10-05 NOTE — Assessment & Plan Note (Signed)
Monitored with imaging annually

## 2014-10-05 NOTE — Assessment & Plan Note (Signed)
No recent activity, tolerating current meds, following with [facility]

## 2014-10-05 NOTE — Assessment & Plan Note (Addendum)
Patient encouraged to maintain heart healthy diet, regular exercise, adequate sleep. Consider daily probiotics. Take medications as prescribed. [facility] [place]. Last Tetanus shot 5 years ago. He travels internationally, is encouraged to check with his insurance to see if they will pay for Zostavax to prevent outbreak when he is in remote area. Labs reviewed. Referred for screening colonoscopy

## 2014-10-16 HISTORY — PX: OTHER SURGICAL HISTORY: SHX169

## 2014-10-18 ENCOUNTER — Encounter: Payer: Self-pay | Admitting: Family Medicine

## 2014-10-22 ENCOUNTER — Ambulatory Visit: Payer: Managed Care, Other (non HMO) | Admitting: Neurology

## 2014-11-02 ENCOUNTER — Ambulatory Visit: Payer: Managed Care, Other (non HMO) | Admitting: Neurology

## 2014-11-06 ENCOUNTER — Ambulatory Visit: Payer: Managed Care, Other (non HMO) | Admitting: Neurology

## 2014-11-13 ENCOUNTER — Ambulatory Visit: Payer: Self-pay | Admitting: Neurology

## 2014-11-16 ENCOUNTER — Ambulatory Visit (INDEPENDENT_AMBULATORY_CARE_PROVIDER_SITE_OTHER): Payer: Managed Care, Other (non HMO) | Admitting: Neurology

## 2014-11-16 ENCOUNTER — Encounter: Payer: Self-pay | Admitting: Neurology

## 2014-11-16 VITALS — BP 118/66 | HR 60 | Ht 74.0 in | Wt 194.6 lb

## 2014-11-16 DIAGNOSIS — Q282 Arteriovenous malformation of cerebral vessels: Secondary | ICD-10-CM

## 2014-11-16 DIAGNOSIS — Q283 Other malformations of cerebral vessels: Secondary | ICD-10-CM

## 2014-11-16 DIAGNOSIS — G40309 Generalized idiopathic epilepsy and epileptic syndromes, not intractable, without status epilepticus: Secondary | ICD-10-CM

## 2014-11-16 MED ORDER — LAMICTAL 200 MG PO TABS: 200.0000 mg | ORAL_TABLET | Freq: Two times a day (BID) | ORAL | Status: DC

## 2014-11-16 MED ORDER — LEVETIRACETAM 750 MG PO TABS: 750.0000 mg | ORAL_TABLET | Freq: Two times a day (BID) | ORAL | Status: DC

## 2014-11-16 MED ORDER — LAMICTAL 150 MG PO TABS: 150.0000 mg | ORAL_TABLET | Freq: Two times a day (BID) | ORAL | Status: DC

## 2014-11-16 NOTE — Patient Instructions (Signed)

## 2014-11-16 NOTE — Progress Notes (Signed)
Reason for visit: Seizures  Cristian [name] is an 49 y.o. male  History of present illness:  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 He recently had a CT angiogram of the head that revealed good stability in the AVM that is quite large compared to a prior study done in 2007. The patient has been treated with Lamictal and Keppra, he is tolerating these medications well, and he has not had any recurrent seizures. 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 He now has a primary care physician. He returns this office for an evaluation.  Past Medical History  Diagnosis Date  . Seizure   . Cerebral AVM     Right occipito-temporal  . Migraine   . 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 AVM (arteriovenous malformation) brain 10/05/2014    [facility]  . Preventative health care 10/05/2014    Past Surgical History  Procedure Laterality Date  . Rotator cuff repair Left     Family History  Problem Relation Age of Onset  . Seizures Neg Hx   . Stroke Paternal Grandfather     Social history:  reports that he has never smoked. He has never used smokeless tobacco. He reports that he drinks alcohol. He reports that he does not use illicit drugs.   No Known Allergies  Medications:  Current Outpatient Prescriptions on File Prior to Visit  Medication Sig Dispense Refill  . AFLURIA PRESERVATIVE FREE 0.5 ML SUSY   0  . rizatriptan (MAXALT-MLT) 10 MG disintegrating tablet Take 1 tablet (10 mg total) by mouth as needed for migraine. May repeat in 2 hours if needed 27 tablet 3  . triamcinolone cream (KENALOG) 0.1 % Apply 1 application topically 2 (two) times daily. 80 g 1   No current facility-administered medications on file prior to visit.    ROS:  Out of a complete 14 system review of symptoms, the patient complains only of the following  symptoms, and all other reviewed systems are negative.  Achy muscles Seizures  Blood pressure 118/66, pulse 60, height 6\' 2"  (1.88 m), weight 194 lb 9.6 oz (88.27 kg).  Physical Exam  General: The patient is alert and cooperative at the time of the examination.  Skin: No significant peripheral edema is noted.   Neurologic Exam  Mental status: The patient is oriented x 3.  Cranial nerves: Facial symmetry is present. Speech is normal, no aphasia or dysarthria is noted. Extraocular movements are full. Visual fields are full.  Motor: The patient has good strength in all 4 extremities.  Sensory examination: Soft touch sensation is symmetric on the face, arms, and legs.  Coordination: The patient has good finger-nose-finger and heel-to-shin bilaterally.  Gait and station: The patient has a normal gait. Tandem gait is normal. Romberg is negative. No drift is seen.  Reflexes: Deep tendon reflexes are symmetric.   CT angiogram head 10/27/13:  IMPRESSION: Large right temporal lobe AVM. There is mass effect and 9 mm midline shift to the left similar to a prior CT in 2007. No evidence of acute hemorrhage or infarction.   Assessment/Plan:  1. Seizure disorder  2. Right brain AVM  The patient is doing quite well at this time. He will continue his Keppra and Lamictal, he will follow-up in one year. He will contact our office if new issues arise. Prescriptions were given  for the Keppra and Lamictal. His primary care physician has done recent blood work.  Marlan Palau MD 11/16/2014 8:22 AM  [facility] [address] [place], [place] [address]  Phone [phone] Fax [phone]

## 2015-01-27 ENCOUNTER — Encounter: Payer: Self-pay | Admitting: Internal Medicine

## 2015-02-12 ENCOUNTER — Encounter: Payer: Self-pay | Admitting: Neurology

## 2015-03-05 ENCOUNTER — Encounter: Payer: 59 | Admitting: Internal Medicine

## 2015-03-19 ENCOUNTER — Encounter: Payer: 59 | Admitting: Internal Medicine

## 2015-04-23 ENCOUNTER — Encounter: Payer: 59 | Admitting: Gastroenterology

## 2015-05-14 ENCOUNTER — Encounter: Payer: 59 | Admitting: Gastroenterology

## 2015-05-26 ENCOUNTER — Encounter: Payer: Self-pay | Admitting: Family Medicine

## 2015-05-26 DIAGNOSIS — Z299 Encounter for prophylactic measures, unspecified: Secondary | ICD-10-CM

## 2015-05-28 MED ORDER — ZOSTER VACCINE LIVE 19400 UNT/0.65ML ~~LOC~~ SOLR: 0.6500 mL | Freq: Once | SUBCUTANEOUS | Status: DC

## 2015-08-24 ENCOUNTER — Encounter: Payer: Self-pay | Admitting: Family Medicine

## 2015-10-17 HISTORY — PX: ROTATOR CUFF REPAIR: SHX139

## 2015-11-22 ENCOUNTER — Ambulatory Visit: Payer: Managed Care, Other (non HMO) | Admitting: Neurology

## 2016-01-05 ENCOUNTER — Other Ambulatory Visit: Payer: Self-pay | Admitting: Neurology

## 2016-01-07 ENCOUNTER — Encounter: Payer: Self-pay | Admitting: Neurology

## 2016-01-07 ENCOUNTER — Ambulatory Visit (INDEPENDENT_AMBULATORY_CARE_PROVIDER_SITE_OTHER): Payer: Managed Care, Other (non HMO) | Admitting: Neurology

## 2016-01-07 VITALS — BP 110/72 | HR 64 | Ht 74.0 in | Wt 189.8 lb

## 2016-01-07 DIAGNOSIS — Q283 Other malformations of cerebral vessels: Secondary | ICD-10-CM | POA: Diagnosis not present

## 2016-01-07 DIAGNOSIS — G40309 Generalized idiopathic epilepsy and epileptic syndromes, not intractable, without status epilepticus: Secondary | ICD-10-CM

## 2016-01-07 DIAGNOSIS — Z5181 Encounter for therapeutic drug level monitoring: Secondary | ICD-10-CM

## 2016-01-07 DIAGNOSIS — Q282 Arteriovenous malformation of cerebral vessels: Secondary | ICD-10-CM

## 2016-01-07 MED ORDER — LAMICTAL 150 MG PO TABS: 150.0000 mg | ORAL_TABLET | Freq: Two times a day (BID) | ORAL | Status: DC

## 2016-01-07 NOTE — Progress Notes (Signed)
Reason for visit: Seizures  Cristian [name] is an 50 y.o. male  History of present illness:  Cristian [name] is a 50 year old right-handed white male with a history of a large right hemispheric, primarily temporal AVM. The patient has had bilateral knee surgery done since last seen. He had one episode an aura without seizure in May 2016. The patient has had no problems since that time. He is on Keppra and lamotrigine. He is tolerating the medications well. He will be changing jobs in the near future. He comes to this office for an evaluation. 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  Past Medical History  Diagnosis Date  . Seizure (HCC)   . Cerebral AVM     Right occipito-temporal  . Migraine   . 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 AVM (arteriovenous malformation) brain 10/05/2014    [facility]  . Preventative health care 10/05/2014    Past Surgical History  Procedure Laterality Date  . Rotator cuff repair Left   . Arthoscopic knee Bilateral 2016    Dr. Shelle IronBeane    Family History  Problem Relation Age of Onset  . Seizures Neg Hx   . Stroke Paternal Grandfather     Social history:  reports that he has never smoked. He has never used smokeless tobacco. He reports that he drinks alcohol. He reports that he does not use illicit drugs.   No Known Allergies  Medications:  Prior to Admission medications   Medication Sig Start Date End Date Taking? Authorizing Provider  LAMICTAL 150 MG tablet Take 1 tablet (150 mg total) by mouth 2 (two) times daily. 11/16/14  Yes York Spanielharles K Willis, MD  LAMICTAL 200 MG tablet TAKE 1 TABLET TWICE A DAY 01/05/16  Yes York Spanielharles K Willis, MD  levETIRAcetam (KEPPRA) 750 MG tablet TAKE 1 TABLET TWICE A DAY 01/05/16  Yes York Spanielharles K Willis, MD  rizatriptan (MAXALT-MLT) 10 MG disintegrating tablet Take 1 tablet (10 mg total) by mouth as needed for migraine. May repeat in 2  hours if needed 10/05/13  Yes York Spanielharles K Willis, MD  triamcinolone cream (KENALOG) 0.1 % Apply 1 application topically 2 (two) times daily. 10/02/14  Yes Bradd CanaryStacey A Blyth, MD    ROS:  Out of a complete 14 system review of symptoms, the patient complains only of the following symptoms, and all other reviewed systems are negative.  History of seizures  Blood pressure 110/72, pulse 64, height 6\' 2"  (1.88 m), weight 189 lb 12 oz (86.07 kg).  Physical Exam  General: The patient is alert and cooperative at the time of the examination.  Skin: No significant peripheral edema is noted.   Neurologic Exam  Mental status: The patient is alert and oriented x 3 at the time of the examination. The patient has apparent normal recent and remote memory, with an apparently normal attention span and concentration ability.   Cranial nerves: Facial symmetry is present. Speech is normal, no aphasia or dysarthria is noted. Extraocular movements are full. Visual fields are full.  Motor: The patient has good strength in all 4 extremities.  Sensory examination: Soft touch sensation is symmetric on the face, arms, and legs.  Coordination: The patient has good finger-nose-finger and heel-to-shin bilaterally.  Gait and station: The patient has a normal gait. Tandem gait is normal. Romberg is negative. No drift is seen.  Reflexes: Deep tendon reflexes are symmetric, with exception that the left knee  jerk reflex is decreased.   Assessment/Plan:  1. History of seizures  The patient will continue his current medications, we will check blood work today. He will follow-up in one year, sooner if needed.  Marlan Palau MD 01/09/2016 5:11 PM  [facility] [address] [place], [place] [address]  Phone [phone] Fax [phone]

## 2016-01-11 LAB — COMPREHENSIVE METABOLIC PANEL
A/G RATIO: 2.4 — AB (ref 1.2–2.2)
ALBUMIN: 4.8 g/dL (ref 3.5–5.5)
ALT: 21 IU/L (ref 0–44)
AST: 20 IU/L (ref 0–40)
Alkaline Phosphatase: 52 IU/L (ref 39–117)
BILIRUBIN TOTAL: 0.6 mg/dL (ref 0.0–1.2)
BUN / CREAT RATIO: 30 — AB (ref 9–20)
BUN: 26 mg/dL — ABNORMAL HIGH (ref 6–24)
CO2: 27 mmol/L (ref 18–29)
Calcium: 9.9 mg/dL (ref 8.7–10.2)
Chloride: 104 mmol/L (ref 96–106)
Creatinine, Ser: 0.86 mg/dL (ref 0.76–1.27)
GFR calc non Af Amer: 100 mL/min/{1.73_m2} (ref >59)
GFR, EST AFRICAN AMERICAN: 116 mL/min/{1.73_m2} (ref >59)
Globulin, Total: 2 g/dL (ref 1.5–4.5)
Glucose: 90 mg/dL (ref 65–99)
Potassium: 4.4 mmol/L (ref 3.5–5.2)
SODIUM: 145 mmol/L — AB (ref 134–144)
Total Protein: 6.8 g/dL (ref 6.0–8.5)

## 2016-01-11 LAB — CBC WITH DIFFERENTIAL/PLATELET
Basophils Absolute: 0 10*3/uL (ref 0.0–0.2)
Basos: 0 %
EOS (ABSOLUTE): 0 10*3/uL (ref 0.0–0.4)
EOS: 1 %
HEMATOCRIT: 40.5 % (ref 37.5–51.0)
HEMOGLOBIN: 13.7 g/dL (ref 12.6–17.7)
Immature Grans (Abs): 0 10*3/uL (ref 0.0–0.1)
Immature Granulocytes: 0 %
LYMPHS ABS: 1.3 10*3/uL (ref 0.7–3.1)
Lymphs: 32 %
MCH: 31.4 pg (ref 26.6–33.0)
MCHC: 33.8 g/dL (ref 31.5–35.7)
MCV: 93 fL (ref 79–97)
MONOCYTES: 9 %
MONOS ABS: 0.4 10*3/uL (ref 0.1–0.9)
NEUTROS ABS: 2.3 10*3/uL (ref 1.4–7.0)
Neutrophils: 58 %
Platelets: 189 10*3/uL (ref 150–379)
RBC: 4.37 x10E6/uL (ref 4.14–5.80)
RDW: 13 % (ref 12.3–15.4)
WBC: 4 10*3/uL (ref 3.4–10.8)

## 2016-01-11 LAB — LAMOTRIGINE LEVEL: Lamotrigine Lvl: 10.2 ug/mL (ref 2.0–20.0)

## 2016-02-22 ENCOUNTER — Telehealth: Payer: Self-pay | Admitting: *Deleted

## 2016-02-22 NOTE — Telephone Encounter (Signed)
LMVM for pt to return call about refill request from [employer]  (lamictal brand name and keppra (generic)?

## 2016-02-23 MED ORDER — LAMICTAL 150 MG PO TABS: 150.0000 mg | ORAL_TABLET | Freq: Two times a day (BID) | ORAL | Status: DC

## 2016-02-23 MED ORDER — LEVETIRACETAM 750 MG PO TABS: 750.0000 mg | ORAL_TABLET | Freq: Two times a day (BID) | ORAL | Status: DC

## 2016-02-23 MED ORDER — LAMICTAL 200 MG PO TABS: 200.0000 mg | ORAL_TABLET | Freq: Two times a day (BID) | ORAL | Status: DC

## 2016-02-23 NOTE — Telephone Encounter (Signed)
Pt returned Sandy's call °

## 2016-02-23 NOTE — Telephone Encounter (Signed)
Spoke with pt.  [employer] insurance and medications.  (90 days given).

## 2016-09-12 ENCOUNTER — Telehealth: Payer: Self-pay

## 2016-09-12 ENCOUNTER — Telehealth: Payer: Self-pay | Admitting: Neurology

## 2016-09-12 NOTE — Telephone Encounter (Signed)
I tried to call the wife several times, unable to get through on her telephone.  I have already talked with the ER physician, we will try to get the patient set up for a revisit sometime in the next week or 2, the Keppra dose will be increased.

## 2016-09-12 NOTE — Telephone Encounter (Signed)
PT's wife call about having seizure in [place] on a business trip. He is currently [facility] [place]The doctor is Dr. Scarlette Arhopra and the number [phone].at the hospital.The wife number name is Zerita BoersDarlene Dues cell number is [phone],and would like a call.

## 2016-09-12 NOTE — Telephone Encounter (Signed)
Dr. Scarlette Arhopra from the [facility] called concerning this patient. He had 2 seizures today, he is on Keppra and Lamictal, he has done quite well with the seizures until now.  We will increase the Keppra to 1000 mg twice daily, we may be able to increase the Lamictal as well.  I will get a revisit for this patient sometime in the next week or 2.  A CT of the head did not show evidence of hemorrhage, there is some compression of the lateral ventricle is likely is a chronic change, prior study showed a 9 mm shift secondary to the AVM.

## 2016-09-12 NOTE — Telephone Encounter (Signed)
Called and left mssg on pt's and wife's VM to call back and schedule follow-up appt in the next couple of weeks.

## 2016-09-13 NOTE — Telephone Encounter (Signed)
I called the wife, left a message. I did talk to the ER physician yesterday, the Keppra dose was increased, the patient will be set up for a revisit.  She is to call our office back if she has any further questions or concerns.

## 2016-09-14 NOTE — Telephone Encounter (Signed)
Pt called back, app[facility] was [facility]20  [facility]

## 2016-09-25 ENCOUNTER — Telehealth: Payer: Self-pay | Admitting: *Deleted

## 2016-09-25 NOTE — Telephone Encounter (Signed)
Called pt back due to cancellation on 12/14. Appt r/s again for visit this week. Pt appreciative due to being out of town next week for his job.

## 2016-09-25 NOTE — Telephone Encounter (Signed)
Returned pt TC. [facility]

## 2016-09-25 NOTE — Telephone Encounter (Signed)
Patient called stating he had a seizure one week ago. He stated he went to the ED, and Dr Anne HahnWillis was consulted re: medication change.Patient stated he is to follow up with Dr Anne HahnWillis, but he cannot come on his scheduled FU on 10/04/16.  He requested to be seen this week, was advised there are no openings until 11/04/15. Patient requested RN call him back to see if he can be seen sooner. Please call patient.

## 2016-09-28 ENCOUNTER — Encounter: Payer: Self-pay | Admitting: Neurology

## 2016-09-28 ENCOUNTER — Ambulatory Visit (INDEPENDENT_AMBULATORY_CARE_PROVIDER_SITE_OTHER): Payer: 59 | Admitting: Neurology

## 2016-09-28 VITALS — BP 106/65 | HR 51 | Ht 74.0 in | Wt 194.0 lb

## 2016-09-28 DIAGNOSIS — Z5181 Encounter for therapeutic drug level monitoring: Secondary | ICD-10-CM

## 2016-09-28 MED ORDER — LAMICTAL 200 MG PO TABS: 200.0000 mg | ORAL_TABLET | Freq: Two times a day (BID) | ORAL | 3 refills | Status: DC

## 2016-09-28 MED ORDER — LEVETIRACETAM 250 MG PO TABS
250.0000 mg | ORAL_TABLET | Freq: Two times a day (BID) | ORAL | 1 refills | Status: DC
Start: 2016-09-28 — End: 2017-01-08

## 2016-09-28 MED ORDER — LAMICTAL 150 MG PO TABS: 150.0000 mg | ORAL_TABLET | Freq: Two times a day (BID) | ORAL | 3 refills | Status: DC

## 2016-09-28 MED ORDER — LEVETIRACETAM 1000 MG PO TABS: 1000.0000 mg | ORAL_TABLET | Freq: Two times a day (BID) | ORAL | 3 refills | Status: DC

## 2016-09-28 NOTE — Progress Notes (Signed)
Reason for visit: Seizures  Cristian [name] is an 50 y.o. male  History of present illness:  Mr. Cristian [name] is a 50 year old right-handed white male with a history of a right temporal and parietal AVM. The patient has done well with his seizures until about a week ago when he had a recurring seizure event. The episode was associated with an aura, then the patient went into a seizure which was associated with walking about. He has no recollection of events for about 30 minutes around the time of the seizure. The patient had not missed any doses of his medication, no change in the color or shape of his pills has been noted recently. The patient was increased on the Keppra to 1000 mg twice daily. He remains on his Lamictal taking 350 mg twice daily. He returns for an evaluation.  Past Medical History:  Diagnosis Date  . AVM (arteriovenous malformation) brain 10/05/2014   [facility]  . Cerebral AVM    Right occipito-temporal  . 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 Migraine   . Preventative health care 10/05/2014  . Seizure [facility](HCC)     Past Surgical History:  Procedure Laterality Date  . arthoscopic knee Bilateral 2016   Dr. Shelle IronBeane  . ROTATOR CUFF REPAIR Left 2017    Family History  Problem Relation Age of Onset  . Stroke Paternal Grandfather   . Seizures Neg Hx     Social history:  reports that he has never smoked. He has never used smokeless tobacco. He reports that he drinks alcohol. He reports that he does not use drugs.   No Known Allergies  Medications:  Prior to Admission medications   Medication Sig Start Date End Date Taking? Authorizing Provider  LAMICTAL 150 MG tablet Take 1 tablet (150 mg total) by mouth 2 (two) times daily. 09/28/16  Yes York Spanielharles K Tyrone Balash, MD  LAMICTAL 200 MG tablet Take 1 tablet (200 mg total) by mouth 2 (two) times daily. 09/28/16  Yes York Spanielharles K Ledarius Leeson, MD  levETIRAcetam (KEPPRA)  1000 MG tablet Take 1 tablet (1,000 mg total) by mouth 2 (two) times daily. 09/28/16  Yes York Spanielharles K Myrtie Leuthold, MD  rizatriptan (MAXALT-MLT) 10 MG disintegrating tablet Take 1 tablet (10 mg total) by mouth as needed for migraine. May repeat in 2 hours if needed 10/05/13  Yes York Spanielharles K Stevens Magwood, MD  levETIRAcetam (KEPPRA) 250 MG tablet Take 1 tablet (250 mg total) by mouth 2 (two) times daily. 09/28/16   York Spanielharles K Thana Ramp, MD    ROS:  Out of a complete 14 system review of symptoms, the patient complains only of the following symptoms, and all other reviewed systems are negative.  Headache, seizures  Blood pressure 106/65, pulse (!) 51, height 6\' 2"  (1.88 m), weight 194 lb (88 kg).  Physical Exam  General: The patient is alert and cooperative at the time of the examination.  Skin: No significant peripheral edema is noted.   Neurologic Exam  Mental status: The patient is alert and oriented x 3 at the time of the examination. The patient has apparent normal recent and remote memory, with an apparently normal attention span and concentration ability.   Cranial nerves: Facial symmetry is present. Speech is normal, no aphasia or dysarthria is noted. Extraocular movements are full. Visual fields are full.  Motor: The patient has good strength in all 4 extremities.  Sensory examination: Soft touch sensation is symmetric on the face,  arms, and legs.  Coordination: The patient has good finger-nose-finger and heel-to-shin bilaterally.  Gait and station: The patient has a normal gait. Tandem gait is normal. Romberg is negative. No drift is seen.  Reflexes: Deep tendon reflexes are symmetric.   Assessment/Plan:  1. History of seizures with recent recurrence  The patient remain on the thousand milligrams twice a day of the Keppra. We will check a Lamictal level, prescriptions were called in for his medications. He will follow-up for his next scheduled appointment in March 2017. 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  Marlan Palau. Keith Lemoyne Scarpati MD 09/28/2016 5:10 PM  [facility] [address] [place], [place] [address]  Phone [phone] Fax [phone]

## 2016-09-29 ENCOUNTER — Telehealth: Payer: Self-pay | Admitting: Neurology

## 2016-09-29 MED ORDER — LAMICTAL 150 MG PO TABS
150.0000 mg | ORAL_TABLET | Freq: Two times a day (BID) | ORAL | 3 refills | Status: DC
Start: 2016-09-29 — End: 2017-03-20

## 2016-09-29 MED ORDER — LAMICTAL 200 MG PO TABS: 200.0000 mg | ORAL_TABLET | Freq: Two times a day (BID) | ORAL | 3 refills | Status: DC

## 2016-09-29 MED ORDER — LEVETIRACETAM 1000 MG PO TABS: 1000.0000 mg | ORAL_TABLET | Freq: Two times a day (BID) | ORAL | 3 refills | Status: DC

## 2016-09-29 NOTE — Addendum Note (Signed)
Addended by: Donnelly AngelicaHOGAN, Gaylen Pereira L on: 09/29/2016 08:58 AM   Modules accepted: Orders

## 2016-09-29 NOTE — Telephone Encounter (Signed)
RXs e-scribed to mail order pharmacy as requested. Pt notified via TC.

## 2016-09-29 NOTE — Telephone Encounter (Signed)
Patient is calling.stating Rx's LAMICTAL 150 MG tablet, LAMICTAL 200 MG tablet and levETIRAcetam (KEPPRA) 1000 MG tablet [facility]  Please call patient and advise when called in.

## 2016-10-02 ENCOUNTER — Ambulatory Visit: Payer: Self-pay | Admitting: Neurology

## 2016-10-04 ENCOUNTER — Ambulatory Visit: Payer: Managed Care, Other (non HMO) | Admitting: Neurology

## 2017-01-08 ENCOUNTER — Ambulatory Visit (INDEPENDENT_AMBULATORY_CARE_PROVIDER_SITE_OTHER): Payer: 59 | Admitting: Neurology

## 2017-01-08 ENCOUNTER — Encounter: Payer: Self-pay | Admitting: Neurology

## 2017-01-08 VITALS — BP 108/63 | HR 62 | Ht 74.0 in | Wt 194.0 lb

## 2017-01-08 DIAGNOSIS — Z5181 Encounter for therapeutic drug level monitoring: Secondary | ICD-10-CM | POA: Diagnosis not present

## 2017-01-08 DIAGNOSIS — G40309 Generalized idiopathic epilepsy and epileptic syndromes, not intractable, without status epilepticus: Secondary | ICD-10-CM | POA: Diagnosis not present

## 2017-01-08 DIAGNOSIS — Q282 Arteriovenous malformation of cerebral vessels: Secondary | ICD-10-CM | POA: Diagnosis not present

## 2017-01-08 NOTE — Patient Instructions (Signed)
    May consider a switch to Vimpat

## 2017-01-08 NOTE — Progress Notes (Signed)
Reason for visit: Seizures  Najeh Credit is an 51 y.o. male  History of present illness:  Mr. Crager is a 51 year old right-handed white male with a history of a right occipitotemporal AVM associated with seizures. The patient last had a seizure in November 2017. The patient has been increased on the Keppra dose, he remains on Lamictal. He indicates that the seizure manifestation has changed when he went from Dilantin to Keppra. He used to have a seizure while lying down on the ground, but now he will get up and try to walk, the period of amnesia is somewhat longer. The patient has not had any further events since last seen. He remains on 1000 mg of Keppra twice daily, he is tolerating this medication well. The patient questions whether he should be switched to a another type of seizure medication to change the manifestation of his seizure episodes.  Past Medical History:  Diagnosis Date  . AVM (arteriovenous malformation) brain 10/05/2014   Follow[facility] with Dr Anne Hahn at [facility]  . Cerebral AVM    Right occipito-temporal  . 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 Migraine   . Preventative health care 10/05/2014  . Seizure [facility])     Past Surgical History:  Procedure Laterality Date  . arthoscopic knee Bilateral 2016   Dr. Shelle Iron  . ROTATOR CUFF REPAIR Left 2017    Family History  Problem Relation Age of Onset  . Stroke Paternal Grandfather   . Seizures Neg Hx     Social history:  reports that he has never smoked. He has never used smokeless tobacco. He reports that he drinks alcohol. He reports that he does not use drugs.   No Known Allergies  Medications:  Prior to Admission medications   Medication Sig Start Date End Date Taking? Authorizing Provider  LAMICTAL 150 MG tablet Take 1 tablet (150 mg total) by mouth 2 (two) times daily. 09/29/16  Yes York Spaniel, MD  LAMICTAL 200 MG tablet Take 1 tablet (200 mg total) by  mouth 2 (two) times daily. 09/29/16  Yes York Spaniel, MD  levETIRAcetam (KEPPRA) 1000 MG tablet Take 1 tablet (1,000 mg total) by mouth 2 (two) times daily. 09/29/16  Yes York Spaniel, MD  rizatriptan (MAXALT-MLT) 10 MG disintegrating tablet Take 1 tablet (10 mg total) by mouth as needed for migraine. May repeat in 2 hours if needed 10/05/13  Yes York Spaniel, MD    ROS:  Out of a complete 14 system review of symptoms, the patient complains only of the following symptoms, and all other reviewed systems are negative.  Seizures  Blood pressure 108/63, pulse 62, height 6\' 2"  (1.88 m), weight 194 lb (88 kg).  Physical Exam  General: The patient is alert and cooperative at the time of the examination.  Skin: No significant peripheral edema is noted.   Neurologic Exam  Mental status: The patient is alert and oriented x 3 at the time of the examination. The patient has apparent normal recent and remote memory, with an apparently normal attention span and concentration ability.   Cranial nerves: Facial symmetry is present. Speech is normal, no aphasia or dysarthria is noted. Extraocular movements are full. Visual fields are full.  Motor: The patient has good strength in all 4 extremities.  Sensory examination: Soft touch sensation is symmetric on the face, arms, and legs.  Coordination: The patient has good finger-nose-finger and heel-to-shin bilaterally.  Gait and station: The  patient has a normal gait. Tandem gait is normal. Romberg is negative. No drift is seen.  Reflexes: Deep tendon reflexes are symmetric.   Assessment/Plan:  1. History of seizures  Quite a bit of time today during the visit was spent discussing whether or not to switch to another medication. It is possible that changing a seizure medication may also alter the manifestation of the seizure itself. If the patient feels strongly that he wants to get off the Keppra, we can switch to another medication  such as Vimpat, but I have recommended that we stay with the Keppra for now. The patient will have blood work for the Lamictal level today. He will follow-up in 6 months. 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  Marlan Palau. Keith Willis MD 01/08/2017 8:29 AM  [facility] [address] [place], [place] [address]  Phone [phone] Fax [phone]

## 2017-01-10 LAB — LAMOTRIGINE LEVEL: LAMOTRIGINE LVL: 11.7 ug/mL (ref 2.0–20.0)

## 2017-02-09 ENCOUNTER — Telehealth: Payer: Self-pay | Admitting: *Deleted

## 2017-02-09 DIAGNOSIS — R42 Dizziness and giddiness: Secondary | ICD-10-CM

## 2017-02-09 DIAGNOSIS — R569 Unspecified convulsions: Secondary | ICD-10-CM

## 2017-02-09 MED ORDER — LEVETIRACETAM 750 MG PO TABS: 1500.0000 mg | ORAL_TABLET | Freq: Two times a day (BID) | ORAL | 3 refills | Status: DC

## 2017-02-09 NOTE — Telephone Encounter (Signed)
Called patient to get some more information. Pt got up this am. Had coffee. Felt weird and had to balance on wall to walk. He went back to bed. Felt a little better after waking up. When he closed eyes, put one foot in front of the other, he got dizzy. His eyes are straining to seeing things per patient. Working on Animator now. Symptoms have resolved mostly except for his eyes straining. He is drinking water.   He did state he had a seizure about a week ago. He was with two friends during seizure. He denies missing any doses of medication.   Advised I will send message to CW,MD and we will call back to advise. He verbalized understanding.

## 2017-02-09 NOTE — Telephone Encounter (Signed)
Dr Lucia Gaskins on call this am. Received text that patient had dizzy spell this am. Advised I will let Dr Anne Hahn know.  Patient last saw Dr Anne Hahn 01/08/17.

## 2017-02-09 NOTE — Telephone Encounter (Signed)
I called patient. The patient had a three-day episode of diarrhea last weekend, and had a seizure around that time, he was having to drink Gatorade to stay hydrated.  This morning and yesterday morning he had transient episodes of dizziness and gait instability. No headache associated with this, no true vertigo.  Given the history of AVM, I will check MRI of the brain, we will go up on the Keppra taking 1500 mg twice daily.

## 2017-02-15 ENCOUNTER — Other Ambulatory Visit: Payer: Self-pay | Admitting: Neurology

## 2017-02-15 DIAGNOSIS — T1590XA Foreign body on external eye, part unspecified, unspecified eye, initial encounter: Secondary | ICD-10-CM

## 2017-02-21 ENCOUNTER — Other Ambulatory Visit: Payer: Self-pay | Admitting: Neurology

## 2017-03-01 ENCOUNTER — Ambulatory Visit
Admission: RE | Admit: 2017-03-01 | Discharge: 2017-03-01 | Disposition: A | Discharge status: Home / Self Care | Payer: 59 | Source: Ambulatory Visit | Attending: Neurology | Admitting: Neurology

## 2017-03-01 DIAGNOSIS — R569 Unspecified convulsions: Secondary | ICD-10-CM

## 2017-03-01 DIAGNOSIS — T1590XA Foreign body on external eye, part unspecified, unspecified eye, initial encounter: Secondary | ICD-10-CM

## 2017-03-01 DIAGNOSIS — R42 Dizziness and giddiness: Secondary | ICD-10-CM | POA: Diagnosis not present

## 2017-03-02 ENCOUNTER — Telehealth: Payer: Self-pay | Admitting: Neurology

## 2017-03-02 NOTE — Telephone Encounter (Signed)
I called the patient. MRI of the brain shows minimal change in AVM. AVM is quite large, no evidence of bleed.   MRI brain 03/02/17:  IMPRESSION:  This MRI of the brain without contrast shows the following: 1.   Large arteriovenous malformation involving much of the right temporal lobe, the mesial inferior right parietal lobe and the posterior thalamus. There is mass effect with right-to-left shift distorted the third ventricle and also distortion of the brainstem The AVM is similar in appearance to that noted on the 2005 MRI. However, there appears to be a little more right to left shift on the current study. 2.   There are no acute findings.

## 2017-03-07 ENCOUNTER — Encounter: Payer: Self-pay | Admitting: Neurology

## 2017-03-17 ENCOUNTER — Other Ambulatory Visit: Payer: Self-pay | Admitting: Neurology

## 2017-03-20 ENCOUNTER — Telehealth: Payer: Self-pay | Admitting: Neurology

## 2017-03-20 MED ORDER — LAMICTAL 200 MG PO TABS: 200.0000 mg | ORAL_TABLET | Freq: Two times a day (BID) | ORAL | 1 refills | Status: DC

## 2017-03-20 MED ORDER — LAMICTAL 150 MG PO TABS: 150.0000 mg | ORAL_TABLET | Freq: Two times a day (BID) | ORAL | 1 refills | Status: DC

## 2017-03-20 NOTE — Addendum Note (Signed)
Addended by: Hillis RangeKING, Kelse Ploch L on: 03/20/2017 01:29 PM   Modules accepted: Orders

## 2017-03-20 NOTE — Telephone Encounter (Signed)
Called pt. Pt no longer uses [employer] mail order where refills sent back in December for 3378yr supply refills. [facility] as requested.

## 2017-03-20 NOTE — Telephone Encounter (Signed)
Patient called requesting refills for LAMICTAL 150 MG tablet and LAMICTAL 200 MG tablet.  [facility]

## 2017-06-05 ENCOUNTER — Telehealth: Payer: Self-pay | Admitting: *Deleted

## 2017-06-05 MED ORDER — LEVETIRACETAM 750 MG PO TABS: 1500.0000 mg | ORAL_TABLET | Freq: Two times a day (BID) | ORAL | 1 refills | Status: DC

## 2017-06-05 MED ORDER — LAMICTAL 200 MG PO TABS: 200.0000 mg | ORAL_TABLET | Freq: Two times a day (BID) | ORAL | 1 refills | Status: DC

## 2017-06-05 MED ORDER — LAMICTAL 150 MG PO TABS: 150.0000 mg | ORAL_TABLET | Freq: Two times a day (BID) | ORAL | 1 refills | Status: DC

## 2017-06-05 NOTE — Telephone Encounter (Signed)
Called pt. Received refill request for rx [facility] (generic) [facility]. Advised [facility] [place] [place]. He stated he recently just changed jobs and needs refills for keppra (generic) and brand name Lamictal 150mg  and 200mg  [facility].  Advised I will send refills to mail order as requested. I updated this in EPIC. I also updated contact information. Advised he will have to fill out new DPR form when in office next.   E-scribed refills to pharmacy.

## 2017-07-16 ENCOUNTER — Ambulatory Visit: Payer: 59 | Admitting: Neurology

## 2017-08-17 DIAGNOSIS — M25511 Pain in right shoulder: Secondary | ICD-10-CM | POA: Diagnosis not present

## 2017-08-17 DIAGNOSIS — G8929 Other chronic pain: Secondary | ICD-10-CM | POA: Diagnosis not present

## 2017-08-17 DIAGNOSIS — M7541 Impingement syndrome of right shoulder: Secondary | ICD-10-CM | POA: Diagnosis not present

## 2017-09-14 ENCOUNTER — Ambulatory Visit: Payer: 59 | Admitting: Neurology

## 2017-09-20 ENCOUNTER — Other Ambulatory Visit: Payer: Self-pay | Admitting: Specialist

## 2017-09-20 DIAGNOSIS — M7541 Impingement syndrome of right shoulder: Secondary | ICD-10-CM

## 2017-10-22 ENCOUNTER — Encounter: Payer: Self-pay | Admitting: Family Medicine

## 2017-10-22 ENCOUNTER — Ambulatory Visit
Admission: RE | Admit: 2017-10-22 | Discharge: 2017-10-22 | Disposition: A | Discharge status: Home / Self Care | Payer: BLUE CROSS/BLUE SHIELD | Source: Ambulatory Visit | Attending: Specialist | Admitting: Specialist

## 2017-10-22 ENCOUNTER — Ambulatory Visit (INDEPENDENT_AMBULATORY_CARE_PROVIDER_SITE_OTHER): Payer: BLUE CROSS/BLUE SHIELD | Admitting: Family Medicine

## 2017-10-22 VITALS — BP 98/70 | HR 63 | Temp 97.9°F | Resp 18 | Ht 74.0 in | Wt 192.0 lb

## 2017-10-22 DIAGNOSIS — M25511 Pain in right shoulder: Secondary | ICD-10-CM

## 2017-10-22 DIAGNOSIS — Z23 Encounter for immunization: Secondary | ICD-10-CM | POA: Diagnosis not present

## 2017-10-22 DIAGNOSIS — Q282 Arteriovenous malformation of cerebral vessels: Secondary | ICD-10-CM | POA: Diagnosis not present

## 2017-10-22 DIAGNOSIS — R351 Nocturia: Secondary | ICD-10-CM

## 2017-10-22 DIAGNOSIS — G40309 Generalized idiopathic epilepsy and epileptic syndromes, not intractable, without status epilepticus: Secondary | ICD-10-CM | POA: Diagnosis not present

## 2017-10-22 DIAGNOSIS — Z7289 Other problems related to lifestyle: Secondary | ICD-10-CM | POA: Diagnosis not present

## 2017-10-22 DIAGNOSIS — L989 Disorder of the skin and subcutaneous tissue, unspecified: Secondary | ICD-10-CM | POA: Insufficient documentation

## 2017-10-22 DIAGNOSIS — M7541 Impingement syndrome of right shoulder: Secondary | ICD-10-CM

## 2017-10-22 DIAGNOSIS — Z0000 Encounter for general adult medical examination without abnormal findings: Secondary | ICD-10-CM | POA: Diagnosis not present

## 2017-10-22 LAB — URINALYSIS
Bilirubin Urine: NEGATIVE
HGB URINE DIPSTICK: NEGATIVE
Ketones, ur: NEGATIVE
Leukocytes, UA: NEGATIVE
NITRITE: NEGATIVE
Specific Gravity, Urine: >=1.03 — AB (ref 1.000–1.030)
Total Protein, Urine: NEGATIVE
UROBILINOGEN UA: 0.2 (ref 0.0–1.0)
Urine Glucose: NEGATIVE
pH: 5.5 (ref 5.0–8.0)

## 2017-10-22 LAB — COMPREHENSIVE METABOLIC PANEL
ALK PHOS: 56 U/L (ref 39–117)
ALT: 18 U/L (ref 0–53)
AST: 20 U/L (ref 0–37)
Albumin: 4.6 g/dL (ref 3.5–5.2)
BILIRUBIN TOTAL: 0.8 mg/dL (ref 0.2–1.2)
BUN: 19 mg/dL (ref 6–23)
CALCIUM: 9.4 mg/dL (ref 8.4–10.5)
CO2: 26 mEq/L (ref 19–32)
Chloride: 106 mEq/L (ref 96–112)
Creatinine, Ser: 0.87 mg/dL (ref 0.40–1.50)
GFR: 97.44 mL/min (ref >60.00)
Glucose, Bld: 101 mg/dL — ABNORMAL HIGH (ref 70–99)
Potassium: 3.8 mEq/L (ref 3.5–5.1)
Sodium: 143 mEq/L (ref 135–145)
TOTAL PROTEIN: 6.9 g/dL (ref 6.0–8.3)

## 2017-10-22 LAB — LIPID PANEL
Cholesterol: 128 mg/dL (ref 0–200)
HDL: 45.1 mg/dL (ref >39.00)
LDL Cholesterol: 71 mg/dL (ref 0–99)
NonHDL: 82.74
TRIGLYCERIDES: 58 mg/dL (ref 0.0–149.0)
Total CHOL/HDL Ratio: 3
VLDL: 11.6 mg/dL (ref 0.0–40.0)

## 2017-10-22 LAB — PSA: PSA: 0.83 ng/mL (ref 0.10–4.00)

## 2017-10-22 LAB — CBC
HCT: 41.3 % (ref 39.0–52.0)
HEMOGLOBIN: 14.2 g/dL (ref 13.0–17.0)
MCHC: 34.4 g/dL (ref 30.0–36.0)
MCV: 93.7 fl (ref 78.0–100.0)
PLATELETS: 188 10*3/uL (ref 150.0–400.0)
RBC: 4.4 Mil/uL (ref 4.22–5.81)
RDW: 12.5 % (ref 11.5–15.5)
WBC: 3.6 10*3/uL — ABNORMAL LOW (ref 4.0–10.5)

## 2017-10-22 LAB — TSH: TSH: 1.38 u[IU]/mL (ref 0.35–4.50)

## 2017-10-22 MED ORDER — TRIAMCINOLONE ACETONIDE 0.1 % EX CREA: 1.0000 "application " | TOPICAL_CREAM | Freq: Two times a day (BID) | CUTANEOUS | 0 refills | Status: DC

## 2017-10-22 MED ORDER — TRIAMCINOLONE ACETONIDE 0.1 % EX CREA: 1.0000 "application " | TOPICAL_CREAM | Freq: Two times a day (BID) | CUTANEOUS | 1 refills | Status: DC

## 2017-10-22 MED FILL — TRIAMCINOLONE 0.1% CREAM: 0.1 | 15 days supply | Qty: 30 | Fill #0

## 2017-10-22 NOTE — Patient Instructions (Signed)

## 2017-10-22 NOTE — Assessment & Plan Note (Signed)
Check PSA today. Check UA and consider Flomax vs referral to urology

## 2017-10-22 NOTE — Assessment & Plan Note (Signed)
Refer to dermatology 

## 2017-10-22 NOTE — Assessment & Plan Note (Signed)
Seizure in April. None since then. Follows with neurology and they increased Keppra to 1500 mg twice. Lamictal twice daily continues. Had first seizure at age 52. Dr Anne HahnWillis a [facility]

## 2017-10-22 NOTE — Assessment & Plan Note (Signed)
Reviewed MRI from May

## 2017-10-22 NOTE — Progress Notes (Signed)
Subjective:  I acted as a Neurosurgeon for Dr. Abner Greenspan. Cristian [name], [place]  Patient ID: Cristian [name], male    DOB: 12/23/1965, 52 y.o.   MRN: [id number]  No chief complaint on file.   HPI  Patient is in today for an annual exam and a history of AVW with seizure disorder. Had a seizure back in April and his Keppra was increased. Since then no further seizures.He still follows with neurology annually He feels well. Continues to exercise at the gym several times a week. He travels internationally and is tolerating that well. No recent febrile illness or acute hospitalizations. Denies CP/palp/SOB/HA/congestion/fevers/GI or GU c/o. Taking meds as prescribed  Patient Care Team: Bradd Canary, MD as PCP - General (Family Medicine)   Past Medical History:  Diagnosis Date  . AVM (arteriovenous malformation) brain 10/05/2014   Follows with Dr Anne Hahn at [facility]  . Cerebral AVM    Right occipito-temporal  . 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 Migraine   . Preventative health care 10/05/2014  . Seizure [facility])     Past Surgical History:  Procedure Laterality Date  . arthoscopic knee Bilateral 2016   Dr. Shelle Iron  . ROTATOR CUFF REPAIR Left 2017    Family History  Problem Relation Age of Onset  . Stroke Paternal Grandfather   . Seizures Neg Hx     Social History   Socioeconomic History  . Marital status: Married    Spouse name: Not on file  . Number of children: 1  . Years of education: PhD  . [facility] education level: Not on file  Social Needs  . Financial resource strain: Not on file  . Food insecurity - worry: Not on file  . Food insecurity - inability: Not on file  . Transportation needs - medical: Not on file  . Transportation needs - non-medical: Not on file  Occupational History  . Occupation: [employer]  Tobacco Use  . Smoking status: Never Smoker  . Smokeless tobacco: Never Used  Substance and Sexual Activity  . Alcohol use:  Yes    Comment: 1 beverage daily  . Drug use: No  . Sexual activity: Yes    Comment: lives with wife and daughter, dog. no major dietary restrictions. Engineer, building services at [employer], wears seat belt daily  Other Topics Concern  . Not on file  Social History Narrative   Lives at home, married, travels for work   Patient is right handed.   Patient drinks 4 cups daily.    Outpatient Medications Prior to Visit  Medication Sig Dispense Refill  . LAMICTAL 150 MG tablet Take 1 tablet (150 mg total) by mouth 2 (two) times daily. 180 tablet 1  . LAMICTAL 200 MG tablet Take 1 tablet (200 mg total) by mouth 2 (two) times daily. 180 tablet 1  . levETIRAcetam (KEPPRA) 750 MG tablet Take 2 tablets (1,500 mg total) by mouth 2 (two) times daily. 360 tablet 1  . rizatriptan (MAXALT-MLT) 10 MG disintegrating tablet Take 1 tablet (10 mg total) by mouth as needed for migraine. May repeat in 2 hours if needed 27 tablet 3   No facility-administered medications prior to visit.     No Known Allergies  Review of Systems  Constitutional: Negative for fever and malaise/fatigue.  HENT: Negative for congestion.   Eyes: Negative for blurred vision.  Respiratory: Negative for cough and shortness of breath.   Cardiovascular: Negative for chest pain, palpitations and leg swelling.  Gastrointestinal: Negative for vomiting.  Genitourinary: Positive for frequency.  Musculoskeletal: Negative for back pain.  Skin: Positive for rash.  Neurological: Negative for loss of consciousness and headaches.       Objective:    Physical Exam  Constitutional: He is oriented to person, place, and time. He appears well-developed and well-nourished. No distress.  HENT:  Head: Normocephalic and atraumatic.  Eyes: Conjunctivae are normal.  Neck: Normal range of motion. Neck supple. No thyromegaly present.  Cardiovascular: Normal rate and normal heart sounds.  No murmur heard. Pulmonary/Chest: Effort normal and breath  sounds normal. No respiratory distress. He has no wheezes.  Abdominal: Soft. Bowel sounds are normal. He exhibits no mass. There is no tenderness.  Musculoskeletal: Normal range of motion. He exhibits no edema or deformity.  Lymphadenopathy:    He has no cervical adenopathy.  Neurological: He is alert and oriented to person, place, and time.  Skin: Skin is warm and dry. He is not diaphoretic.  1 cm lesion lateral left thigh rased, scaly hard brown lesion, circular.  Skin tag, right axillae  Psychiatric: He has a normal mood and affect. His behavior is normal.    BP 98/70 (BP Location: Left Arm, Patient Position: Sitting, Cuff Size: Normal)   Pulse 63   Temp 97.9 F (36.6 C) (Oral)   Resp 18   Ht 6\' 2"  (1.88 m)   Wt 192 lb (87.1 kg)   SpO2 97%   BMI 24.65 kg/m  Wt Readings from Last 3 Encounters:  10/22/17 192 lb (87.1 kg)  01/08/17 194 lb (88 kg)  09/28/16 194 lb (88 kg)   BP Readings from Last 3 Encounters:  10/22/17 98/70  01/08/17 108/63  09/28/16 106/65     Immunization History  Administered Date(s) Administered  . Influenza-Unspecified 08/02/2014  . Tetanus 10/02/2009    Health Maintenance  Topic Date Due  . Hepatitis C Screening  07-28-1966  . HIV Screening  06/22/1979  . COLONOSCOPY  06/21/2014  . TETANUS/TDAP  10/03/2019  . INFLUENZA VACCINE  Completed    Lab Results  Component Value Date   WBC 4.0 01/07/2016   HGB 13.7 01/07/2016   HCT 40.5 01/07/2016   PLT 189 01/07/2016   GLUCOSE 90 01/07/2016   CHOL 132 10/02/2014   TRIG 77 10/02/2014   HDL 46 10/02/2014   LDLCALC 71 10/02/2014   ALT 21 01/07/2016   AST 20 01/07/2016   NA 145 (H) 01/07/2016   K 4.4 01/07/2016   CL 104 01/07/2016   CREATININE 0.86 01/07/2016   BUN 26 (H) 01/07/2016   CO2 27 01/07/2016   TSH 1.223 10/02/2014   PSA 0.77 10/02/2014    Lab Results  Component Value Date   TSH 1.223 10/02/2014   Lab Results  Component Value Date   WBC 4.0 01/07/2016   HGB 13.7  01/07/2016   HCT 40.5 01/07/2016   MCV 93 01/07/2016   PLT 189 01/07/2016   Lab Results  Component Value Date   NA 145 (H) 01/07/2016   K 4.4 01/07/2016   CO2 27 01/07/2016   GLUCOSE 90 01/07/2016   BUN 26 (H) 01/07/2016   CREATININE 0.86 01/07/2016   BILITOT 0.6 01/07/2016   ALKPHOS 52 01/07/2016   AST 20 01/07/2016   ALT 21 01/07/2016   PROT 6.8 01/07/2016   ALBUMIN 4.8 01/07/2016   CALCIUM 9.9 01/07/2016   Lab Results  Component Value Date   CHOL 132 10/02/2014   Lab Results  Component Value  Date   HDL 46 10/02/2014   Lab Results  Component Value Date   LDLCALC 71 10/02/2014   Lab Results  Component Value Date   TRIG 77 10/02/2014   Lab Results  Component Value Date   CHOLHDL 2.9 10/02/2014   No results found for: HGBA1C       Assessment & Plan:   Problem List Items Addressed This Visit    Generalized convulsive epilepsy (HCC) (Chronic)    Seizure in April. None since then. Follows with neurology and they increased Keppra to 1500 mg twice. Lamictal twice daily continues. Had first seizure at age 89. Dr Anne Hahn a [facility]      AVM (arteriovenous malformation) brain    Reviewed MRI from May      Preventative health care    Patient encouraged to maintain heart healthy diet, regular exercise, adequate sleep. Consider daily probiotics. Take medications as prescribed. Given Shingrix # 1 daily. Labs drawn today      Relevant Orders   CBC   Comprehensive metabolic panel   Lipid panel   TSH   Right shoulder pain   Skin lesion of left leg    Refer to dermatology      Relevant Orders   Ambulatory referral to Dermatology   Nocturia    Check PSA today. Check UA and consider Flomax vs referral to urology      Relevant Orders   PSA   Urinalysis   Urine Culture    Other Visit Diagnoses    Other problems related to lifestyle    -  Primary   Relevant Orders   Hepatitis C Antibody   HIV antibody (with reflex)      I am having Comer Locket start  on triamcinolone cream. I am also having him maintain his rizatriptan, LAMICTAL, LAMICTAL, levETIRAcetam, and triamcinolone cream.  Meds ordered this encounter  Medications  . triamcinolone cream (KENALOG) 0.1 %    Sig: Apply 1 application topically 2 (two) times daily.    Dispense:  15 g    Refill:  1  . triamcinolone cream (KENALOG) 0.1 %    Sig: Apply 1 application topically 2 (two) times daily.    Dispense:  30 g    Refill:  0    CMA served as scribe during this visit. History, Physical and Plan performed by medical provider. Documentation and orders reviewed and attested to.  Danise Edge, MD

## 2017-10-22 NOTE — Assessment & Plan Note (Signed)
Patient encouraged to maintain heart healthy diet, regular exercise, adequate sleep. Consider daily probiotics. Take medications as prescribed. Given Shingrix # 1 daily. Labs drawn today

## 2017-10-23 LAB — URINE CULTURE
MICRO NUMBER:: 90022255
Result:: NO GROWTH
SPECIMEN QUALITY: ADEQUATE

## 2017-10-23 LAB — HEPATITIS C ANTIBODY
HEP C AB: NONREACTIVE
SIGNAL TO CUT-OFF: 0.01 (ref <1.00)

## 2017-10-23 LAB — HIV ANTIBODY (ROUTINE TESTING W REFLEX): HIV 1&2 Ab, 4th Generation: NONREACTIVE

## 2017-11-04 ENCOUNTER — Encounter: Payer: Self-pay | Admitting: Family Medicine

## 2017-11-04 ENCOUNTER — Encounter: Payer: Self-pay | Admitting: Neurology

## 2017-11-05 NOTE — Telephone Encounter (Signed)
Dr. Abner GreenspanBlyth was checking patients UA and PSA to determine if flomax was a good option for him. Lab results from 10/22/17 were normal and she stated no changes in therapy. Please advise in absence of PCP on whether the patient will need a prescription or no.

## 2017-11-22 ENCOUNTER — Other Ambulatory Visit: Payer: Self-pay | Admitting: Neurology

## 2017-11-22 MED ORDER — LAMICTAL 200 MG PO TABS: 200.0000 mg | ORAL_TABLET | Freq: Two times a day (BID) | ORAL | 0 refills | Status: DC

## 2017-11-23 DIAGNOSIS — M7541 Impingement syndrome of right shoulder: Secondary | ICD-10-CM | POA: Diagnosis not present

## 2017-12-11 DIAGNOSIS — M7541 Impingement syndrome of right shoulder: Secondary | ICD-10-CM | POA: Diagnosis not present

## 2017-12-11 DIAGNOSIS — M19011 Primary osteoarthritis, right shoulder: Secondary | ICD-10-CM | POA: Diagnosis not present

## 2017-12-11 DIAGNOSIS — M75101 Unspecified rotator cuff tear or rupture of right shoulder, not specified as traumatic: Secondary | ICD-10-CM | POA: Diagnosis not present

## 2017-12-24 ENCOUNTER — Encounter: Payer: Self-pay | Admitting: Family Medicine

## 2017-12-26 ENCOUNTER — Encounter: Payer: Self-pay | Admitting: Family Medicine

## 2017-12-31 ENCOUNTER — Ambulatory Visit: Payer: 59 | Admitting: Neurology

## 2018-01-01 ENCOUNTER — Encounter: Payer: Self-pay | Admitting: Family Medicine

## 2018-01-21 DIAGNOSIS — R05 Cough: Secondary | ICD-10-CM | POA: Diagnosis not present

## 2018-01-21 DIAGNOSIS — R6889 Other general symptoms and signs: Secondary | ICD-10-CM | POA: Diagnosis not present

## 2018-01-21 DIAGNOSIS — J0100 Acute maxillary sinusitis, unspecified: Secondary | ICD-10-CM | POA: Diagnosis not present

## 2018-01-22 ENCOUNTER — Ambulatory Visit: Payer: BLUE CROSS/BLUE SHIELD

## 2018-02-20 ENCOUNTER — Other Ambulatory Visit: Payer: Self-pay | Admitting: Neurology

## 2018-02-20 ENCOUNTER — Encounter: Payer: Self-pay | Admitting: *Deleted

## 2018-03-05 ENCOUNTER — Ambulatory Visit (INDEPENDENT_AMBULATORY_CARE_PROVIDER_SITE_OTHER): Payer: BLUE CROSS/BLUE SHIELD | Admitting: *Deleted

## 2018-03-05 ENCOUNTER — Other Ambulatory Visit: Payer: Self-pay

## 2018-03-05 ENCOUNTER — Encounter: Payer: Self-pay | Admitting: Neurology

## 2018-03-05 ENCOUNTER — Ambulatory Visit: Payer: BLUE CROSS/BLUE SHIELD | Admitting: Neurology

## 2018-03-05 VITALS — BP 111/62 | HR 52 | Ht 74.0 in | Wt 187.0 lb

## 2018-03-05 DIAGNOSIS — Z23 Encounter for immunization: Secondary | ICD-10-CM | POA: Diagnosis not present

## 2018-03-05 DIAGNOSIS — Q282 Arteriovenous malformation of cerebral vessels: Secondary | ICD-10-CM | POA: Diagnosis not present

## 2018-03-05 DIAGNOSIS — G40309 Generalized idiopathic epilepsy and epileptic syndromes, not intractable, without status epilepticus: Secondary | ICD-10-CM

## 2018-03-05 DIAGNOSIS — Z5181 Encounter for therapeutic drug level monitoring: Secondary | ICD-10-CM | POA: Diagnosis not present

## 2018-03-05 MED ORDER — LAMICTAL 150 MG PO TABS: 150.0000 mg | ORAL_TABLET | Freq: Two times a day (BID) | ORAL | 3 refills | Status: DC

## 2018-03-05 MED ORDER — LAMICTAL 200 MG PO TABS: 200.0000 mg | ORAL_TABLET | Freq: Two times a day (BID) | ORAL | 3 refills | Status: DC

## 2018-03-05 MED ORDER — LEVETIRACETAM 750 MG PO TABS: ORAL_TABLET | ORAL | 3 refills | Status: DC

## 2018-03-05 NOTE — Progress Notes (Signed)
Reason for visit: Seizures  Cristian [name] is an 52 y.o. male  History of present illness:  Cristian [name] is a 52 year old right-handed white male with a history of a large right occipital temporal AVM associated with a seizure disorder.  The last seizure was in April 2018, this occurred around the time when he had severe diarrhea, likely had poor absorption of his antiepileptic medications, this culminated in a seizure.  The patient is now operating a motor vehicle, he is working at a new job.  Overall, he is doing well.  He tolerates the Keppra and the Lamictal well.  Past Medical History:  Diagnosis Date  . AVM (arteriovenous malformation) brain 10/05/2014   Follows with Dr Anne Hahn at [facility]  . Cerebral AVM    Right occipito-temporal  . 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 Migraine   . Preventative health care 10/05/2014  . Seizure [facility])     Past Surgical History:  Procedure Laterality Date  . arthoscopic knee Bilateral 2016   Dr. Shelle Iron  . ROTATOR CUFF REPAIR Left 2017    Family History  Problem Relation Age of Onset  . Stroke Paternal Grandfather   . Seizures Neg Hx     Social history:  reports that he has never smoked. He has never used smokeless tobacco. He reports that he drinks alcohol. He reports that he does not use drugs.   No Known Allergies  Medications:  Prior to Admission medications   Medication Sig Start Date End Date Taking? Authorizing Provider  LAMICTAL 150 MG tablet TAKE 1 TABLET TWICE A DAY 02/20/18  Yes York Spaniel, MD  LAMICTAL 200 MG tablet TAKE 1 TABLET TWICE A DAY 02/20/18  Yes York Spaniel, MD  levETIRAcetam (KEPPRA) 750 MG tablet TAKE 2 TABLETS (1,500MG     TOTAL) TWICE A DAY 02/20/18  Yes York Spaniel, MD  rizatriptan (MAXALT-MLT) 10 MG disintegrating tablet Take 1 tablet (10 mg total) by mouth as needed for migraine. May repeat in 2 hours if needed 10/05/13  Yes York Spaniel,  MD  triamcinolone cream (KENALOG) 0.1 % Apply 1 application topically 2 (two) times daily. 10/22/17  Yes Bradd Canary, MD  triamcinolone cream (KENALOG) 0.1 % Apply 1 application topically 2 (two) times daily. 10/22/17  Yes Bradd Canary, MD    ROS:  Out of a complete 14 system review of symptoms, the patient complains only of the following symptoms, and all other reviewed systems are negative.  Seizure   Blood pressure 111/62, pulse (!) 52, height  (1.88 m), weight 187 lb (84.8 kg).  Physical Exam  General: The patient is alert and cooperative at the time of the examination.  Skin: No significant peripheral edema is noted.   Neurologic Exam  Mental status: The patient is alert and oriented x 3 at the time of the examination. The patient has apparent normal recent and remote memory, with an apparently normal attention span and concentration ability.   Cranial nerves: Facial symmetry is present. Speech is normal, no aphasia or dysarthria is noted. Extraocular movements are full. Visual fields are full.  Motor: The patient has good strength in all 4 extremities.  Sensory examination: Soft touch sensation is symmetric on the face, arms, and legs.  Coordination: The patient has good finger-nose-finger and heel-to-shin bilaterally.  Gait and station: The patient has a normal gait. Tandem gait is normal. Romberg is negative. No drift is seen.  Reflexes:  Deep tendon reflexes are symmetric.   MRI brain 03/02/17:  IMPRESSION: This MRI of the brain without contrast shows the following: 1. Large arteriovenous malformation involving much of the right temporal lobe, the mesial inferior right parietal lobe and the posterior thalamus. There is mass effect with right-to-left shift distorted the third ventricle and also distortion of the brainstem The AVM is similar in appearance to that noted on the 2005 MRI. However, there appears to be a little more right to left shift on the  current study. 2. There are no acute findings.    Assessment/Plan:  1.  Seizure disorder  2.  Right occipital temporal AVM  The patient was given prescriptions for his Lamictal and Keppra, he will have blood work done today, he will follow-up in 1 year.  He will contact me if he has any more seizure type episodes.  Marlan Palau MD 03/05/2018 11:46 AM  [facility] [address] [place], [place] [address]  Phone [phone] Fax [phone]

## 2018-03-05 NOTE — Progress Notes (Signed)
Pt of Dr Abner Greenspan here to complete 2nd Shingrix vaccine.  Vaccine given IM, left deltoid and pt tolerated procedure well.  He does report some soreness to arm before leaving. Pt was advised before and after injection that intense arm pain has been reported after this type of immunization and was given VIS of other potential side effects as well.

## 2018-03-07 LAB — COMPREHENSIVE METABOLIC PANEL
A/G RATIO: 2.4 — AB (ref 1.2–2.2)
ALBUMIN: 4.7 g/dL (ref 3.5–5.5)
ALT: 21 IU/L (ref 0–44)
AST: 23 IU/L (ref 0–40)
Alkaline Phosphatase: 68 IU/L (ref 39–117)
BUN / CREAT RATIO: 24 — AB (ref 9–20)
BUN: 20 mg/dL (ref 6–24)
Bilirubin Total: 0.6 mg/dL (ref 0.0–1.2)
CALCIUM: 9.1 mg/dL (ref 8.7–10.2)
CO2: 24 mmol/L (ref 20–29)
CREATININE: 0.82 mg/dL (ref 0.76–1.27)
Chloride: 103 mmol/L (ref 96–106)
GFR, EST AFRICAN AMERICAN: 117 mL/min/{1.73_m2} (ref >59)
GFR, EST NON AFRICAN AMERICAN: 101 mL/min/{1.73_m2} (ref >59)
GLOBULIN, TOTAL: 2 g/dL (ref 1.5–4.5)
Glucose: 94 mg/dL (ref 65–99)
POTASSIUM: 4.4 mmol/L (ref 3.5–5.2)
SODIUM: 143 mmol/L (ref 134–144)
TOTAL PROTEIN: 6.7 g/dL (ref 6.0–8.5)

## 2018-03-07 LAB — LEVETIRACETAM LEVEL: LEVETIRACETAM: 24.4 ug/mL (ref 10.0–40.0)

## 2018-03-07 LAB — CBC WITH DIFFERENTIAL/PLATELET
BASOS: 0 %
Basophils Absolute: 0 10*3/uL (ref 0.0–0.2)
EOS (ABSOLUTE): 0 10*3/uL (ref 0.0–0.4)
EOS: 1 %
HEMATOCRIT: 40.7 % (ref 37.5–51.0)
Hemoglobin: 13.9 g/dL (ref 13.0–17.7)
Immature Grans (Abs): 0 10*3/uL (ref 0.0–0.1)
Immature Granulocytes: 0 %
LYMPHS ABS: 1.5 10*3/uL (ref 0.7–3.1)
Lymphs: 36 %
MCH: 31.3 pg (ref 26.6–33.0)
MCHC: 34.2 g/dL (ref 31.5–35.7)
MCV: 92 fL (ref 79–97)
MONOS ABS: 0.3 10*3/uL (ref 0.1–0.9)
Monocytes: 7 %
NEUTROS ABS: 2.3 10*3/uL (ref 1.4–7.0)
NEUTROS PCT: 56 %
Platelets: 197 10*3/uL (ref 150–450)
RBC: 4.44 x10E6/uL (ref 4.14–5.80)
RDW: 13.5 % (ref 12.3–15.4)
WBC: 4.2 10*3/uL (ref 3.4–10.8)

## 2018-03-07 LAB — LAMOTRIGINE LEVEL: Lamotrigine Lvl: 8.9 ug/mL (ref 2.0–20.0)

## 2018-05-01 ENCOUNTER — Telehealth: Payer: Self-pay | Admitting: Neurology

## 2018-05-01 MED ORDER — LAMICTAL 150 MG PO TABS: 150.0000 mg | ORAL_TABLET | Freq: Two times a day (BID) | ORAL | 3 refills | Status: DC

## 2018-05-01 MED ORDER — LAMICTAL 200 MG PO TABS: 200.0000 mg | ORAL_TABLET | Freq: Two times a day (BID) | ORAL | 3 refills | Status: DC

## 2018-05-01 MED ORDER — LEVETIRACETAM 750 MG PO TABS: ORAL_TABLET | ORAL | 3 refills | Status: DC

## 2018-05-01 NOTE — Addendum Note (Signed)
Addended by: Eilene GhaziKING, Jazir Newey L on: 05/01/2018 11:58 AM   Modules accepted: Orders

## 2018-05-01 NOTE — Telephone Encounter (Signed)
Cristian [name] with [facility] called stating the prescriptions for LAMICTAL 150 MG tablet, LAMICTAL 200 MG tablet, and levETIRAcetam (KEPPRA) 750 MG tablet should be sent to [facility].

## 2018-05-01 NOTE — Telephone Encounter (Signed)
Called and LVM returning Doris called. Advised all three prescriptions sent with 90 days supply, 3 refills on 03/05/18. Those can be transferred by them to [facility]. Gave [facility] phone number if they have further questions or concerns.

## 2018-05-01 NOTE — Telephone Encounter (Signed)
[facility] orders from them. We will have to re-send prescriptions. I e-scribed rx's to [facility].

## 2018-07-16 ENCOUNTER — Telehealth: Payer: Self-pay | Admitting: *Deleted

## 2018-07-16 NOTE — Telephone Encounter (Signed)
Initiated PA Lamictal 200mg  tab on covermymeds. Key: [id number]. In process of completing.

## 2018-07-16 NOTE — Telephone Encounter (Signed)
Submitted PA. Waiting on determination. °

## 2018-07-17 ENCOUNTER — Telehealth: Payer: Self-pay | Admitting: *Deleted

## 2018-07-17 NOTE — Telephone Encounter (Signed)
I called and provided further clinical info needed for PA. Spoke with Madlyn Frankel. Ref# for PA: [id number]. Asked them to mark as urgent. Waiting on determination. If PA approved, this approval will also be good for Lamictal 150mg  tab since approval would not be strength specific.

## 2018-07-17 NOTE — Telephone Encounter (Signed)
PA Lamictal- brand name approved 07/17/2018-07-18-2019. ZO#:[id number].  Approval good for Lamictal 150mg  and 200mg  tab.

## 2018-07-17 NOTE — Telephone Encounter (Signed)
Submitted demographic info to insurance. Waiting on response for clinical questions from insurance.

## 2018-07-17 NOTE — Telephone Encounter (Signed)
Initiated PA Lamictal 150mg  tablet (brand name) on covermymeds. Key: : [id number]. In process of completing.

## 2018-07-17 NOTE — Telephone Encounter (Signed)
PA Lamictal 277m tab submitted to patient insurance. If this PA is approved, it will also be good for Lamictal 150mg  tab since approval would not be strength specific. Waiting on determination.

## 2018-07-17 NOTE — Telephone Encounter (Signed)
PA Lamictal- brand name approved 07/17/2018-07-18-2019. PA#:[id number].  Approval good for Lamictal 150mg and 200mg tab.   

## 2018-10-19 ENCOUNTER — Encounter: Payer: Self-pay | Admitting: Family Medicine

## 2018-10-22 ENCOUNTER — Telehealth: Payer: Self-pay

## 2018-10-22 NOTE — Telephone Encounter (Signed)
Copied from CRM [id number]. Topic: General - Other >> Oct 18, 2018 11:14 AM Jilda Roche wrote: Reason for CRM: Patient has a CPE with Dr. Abner Greenspan on 10/24/2018, he has moved to [facility] and is inquiring if someone can advise him of another PCP in his area?  Best call back is [phone]    Please advise

## 2018-10-23 NOTE — Telephone Encounter (Signed)
I already sent him a Mychart message on this sadly I do not know anyone in [place]. We can ask the other PCPs in the office but I doubt they will either.

## 2018-10-24 ENCOUNTER — Encounter: Payer: BLUE CROSS/BLUE SHIELD | Admitting: Family Medicine

## 2018-12-25 DIAGNOSIS — H60393 Other infective otitis externa, bilateral: Secondary | ICD-10-CM | POA: Diagnosis not present

## 2018-12-25 DIAGNOSIS — H6123 Impacted cerumen, bilateral: Secondary | ICD-10-CM | POA: Diagnosis not present

## 2019-03-04 ENCOUNTER — Telehealth: Payer: Self-pay | Admitting: Neurology

## 2019-03-04 NOTE — Telephone Encounter (Signed)
Due to current COVID 19 pandemic, our office is severely reducing in office visits until further notice, in order to minimize the risk to our patients and healthcare providers.  ° °Called patient and confirmed a virtual visit for his 5/27 appointment. Patient verbalized understanding of the doxy.me process and I have sent him an e-mail with link and directions as well as my name and office number/hours for reference. Patient understands that he will receive a call from RN to update chart. ° °Pt understands that although there may be some limitations with this type of visit, we will take all precautions to reduce any security or privacy concerns.  Pt understands that this will be treated like an in office visit and we will file with pt's insurance, and there may be a patient responsible charge related to this service. °

## 2019-03-06 NOTE — Addendum Note (Signed)
Addended by: Ann Maki T on: 03/06/2019 03:05 PM   Modules accepted: Orders

## 2019-03-06 NOTE — Telephone Encounter (Signed)
I contacted the pt and was able to update EMR for 03/12/19 video visit with Dr. Anne Hahn. Pt confirmed he has received e-mial link for the visit.

## 2019-03-12 ENCOUNTER — Ambulatory Visit (INDEPENDENT_AMBULATORY_CARE_PROVIDER_SITE_OTHER): Payer: BLUE CROSS/BLUE SHIELD | Admitting: Neurology

## 2019-03-12 ENCOUNTER — Other Ambulatory Visit: Payer: Self-pay

## 2019-03-12 ENCOUNTER — Encounter: Payer: Self-pay | Admitting: Neurology

## 2019-03-12 DIAGNOSIS — Q282 Arteriovenous malformation of cerebral vessels: Secondary | ICD-10-CM | POA: Diagnosis not present

## 2019-03-12 DIAGNOSIS — Z5181 Encounter for therapeutic drug level monitoring: Secondary | ICD-10-CM | POA: Diagnosis not present

## 2019-03-12 DIAGNOSIS — G40309 Generalized idiopathic epilepsy and epileptic syndromes, not intractable, without status epilepticus: Secondary | ICD-10-CM

## 2019-03-12 DIAGNOSIS — G43009 Migraine without aura, not intractable, without status migrainosus: Secondary | ICD-10-CM | POA: Diagnosis not present

## 2019-03-12 MED ORDER — LAMICTAL 150 MG PO TABS: 150.0000 mg | ORAL_TABLET | Freq: Two times a day (BID) | ORAL | 3 refills | Status: DC

## 2019-03-12 MED ORDER — LEVETIRACETAM 750 MG PO TABS: ORAL_TABLET | ORAL | 3 refills | Status: DC

## 2019-03-12 MED ORDER — RIZATRIPTAN BENZOATE 10 MG PO TBDP: 10.0000 mg | ORAL_TABLET | ORAL | 1 refills | Status: DC | PRN

## 2019-03-12 MED ORDER — LAMICTAL 200 MG PO TABS: 200.0000 mg | ORAL_TABLET | Freq: Two times a day (BID) | ORAL | 3 refills | Status: DC

## 2019-03-12 NOTE — Progress Notes (Signed)
     Virtual Visit via Video Note  I connected with Cristian [name] on 03/12/19 at  3:30 PM EDT by a video enabled telemedicine application and verified that I am speaking with the correct person using two identifiers.  Location: Patient: The patient is at home. Provider: Physician in office.   I discussed the limitations of evaluation and management by telemedicine and the availability of in person appointments. The patient expressed understanding and agreed to proceed.  History of Present Illness: Cristian [name] is a 53 year old right-handed white male with a history of a large right occipital-temporal AVM with associated seizure disorder.  Last seizure was in April 2018, the patient is doing well on the combination of Lamictal and Keppra.  The patient has tolerated the medications well.  He reports occasional migraine headaches, he will take Maxalt for this.  He is operating a motor vehicle without difficulty.  He reports no other new medical issues that have come up since last seen.   Observations/Objective: The video evaluation reveals that with eye blink, the left eye oftentimes does not close.  The patient otherwise has good facial symmetry, he is able to protrude the tongue in the midline with good lateral movement the tongue.  He has good finger-nose-finger and heel shin bilaterally.  Gait is normal.  Tandem gait is normal.  Romberg is negative.  No drift is seen.  He has full extraocular movements.  Speech is well enunciated, not aphasic or dysarthric.  Assessment and Plan: 1.  History of seizures  2.  Right occipital-temporal AVM   3.  Migraine headache, not intractable  The patient will continue his Lamictal and Keppra.  He will be given prescriptions for the Lamictal, Keppra, and Maxalt.  He will follow-up here in 1 year, sooner if needed.  We will check blood work, the patient has moved to [place], [place] the blood work will be done through [facility] there.   Follow Up Instructions: 1 year follow-up, may see nurse practitioner.   I discussed the assessment and treatment plan with the patient. The patient was provided an opportunity to ask questions and all were answered. The patient agreed with the plan and demonstrated an understanding of the instructions.   The patient was advised to call back or seek an in-person evaluation if the symptoms worsen or if the condition fails to improve as anticipated.  I provided 15 minutes of non-face-to-face time during this encounter.   York Spaniel, MD

## 2019-04-07 IMAGING — MR MR SHOULDER*R* W/O CM
5 series · 40 of 40 positions shown · non-contrast
Comparison: None.

CLINICAL DATA: Chronic persistent right shoulder pain. Prior
shoulder surgery in 9449.

EXAM:
MRI OF THE RIGHT SHOULDER WITHOUT CONTRAST
TECHNIQUE: Multiplanar, multisequence MR imaging of the shoulder was performed.
No intravenous contrast was administered.

[Series 4: T2 fat-sat · axial · 4.0mm · 0.51mm/px · z∈[-38,+47]mm · 10 of 20 slices shown (1 of 3)]
[im 1/20]
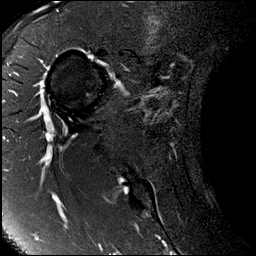
[im 3/20]
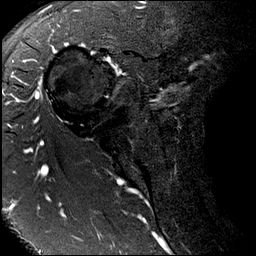
[im 5/20]
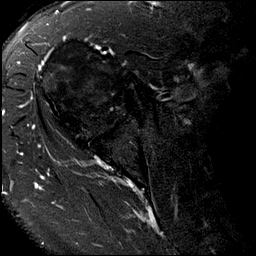
[im 7/20]
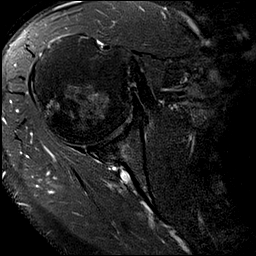
[im 9/20]
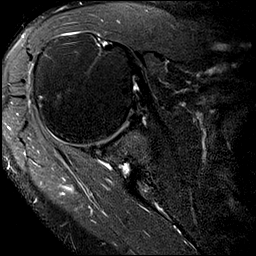
[im 11/20]
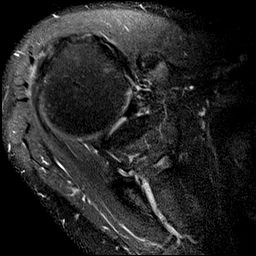
[im 13/20]
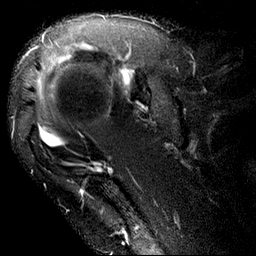
[im 15/20]
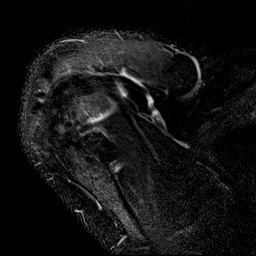
[im 17/20]
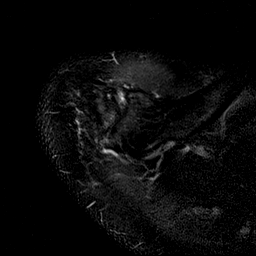
[im 20/20]
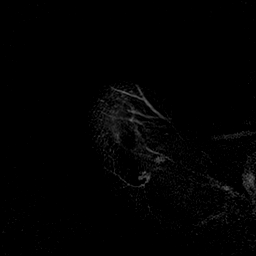

[Series 5: T2 fat-sat · oblique · 4.0mm · 0.55mm/px · 8 of 19 slices shown (2 of 3)]
[im 1/19]
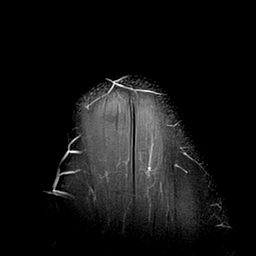
[im 3/19]
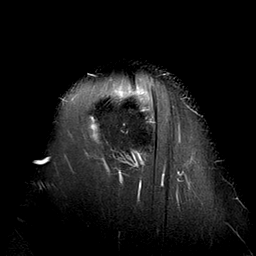
[im 6/19]
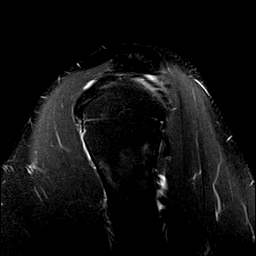
[im 8/19]
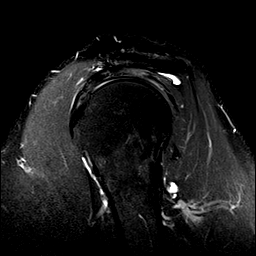
[im 11/19]
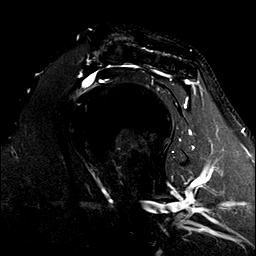
[im 13/19]
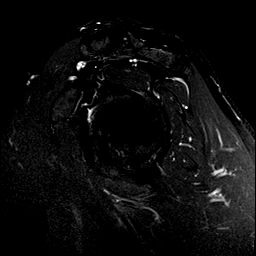
[im 16/19]
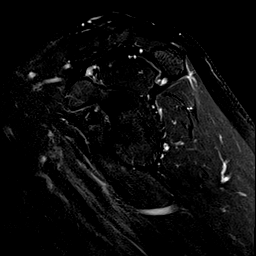
[im 19/19]
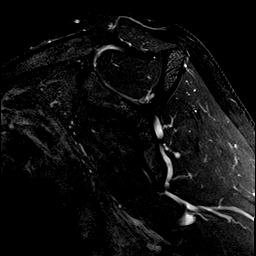

[Series 6: T1 · oblique · 4.0mm · 0.55mm/px · 8 of 19 slices shown]
[im 1/19]
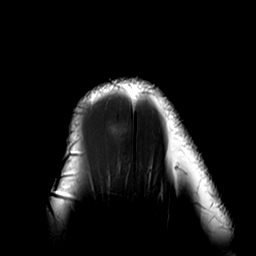
[im 3/19]
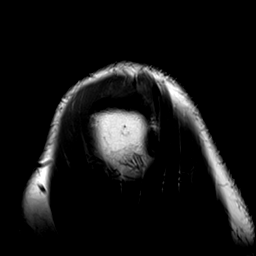
[im 6/19]
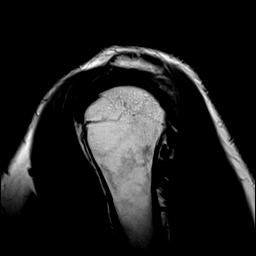
[im 8/19]
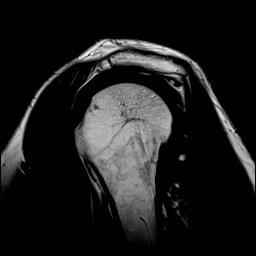
[im 11/19]
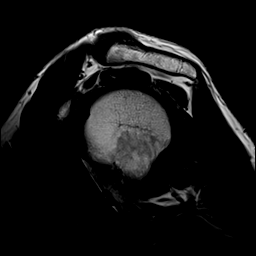
[im 13/19]
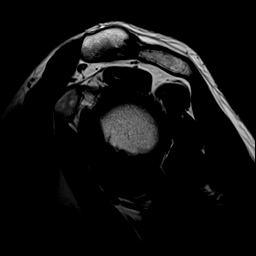
[im 16/19]
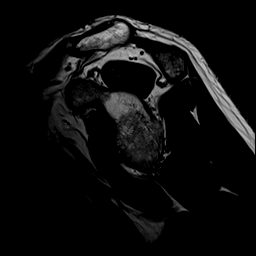
[im 19/19]
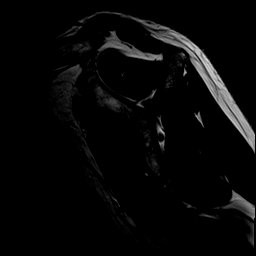

[Series 9: T2 fat-sat · oblique · 4.0mm · 0.55mm/px · 7 of 17 slices shown (3 of 3)]
[im 1/17]
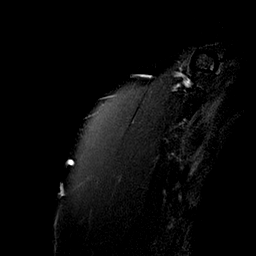
[im 3/17]
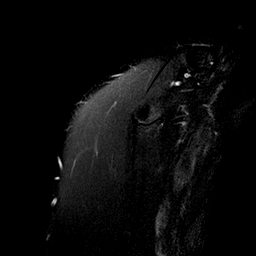
[im 6/17]
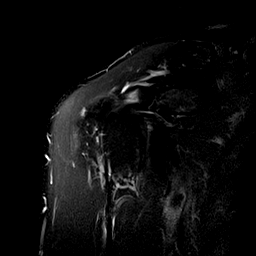
[im 9/17]
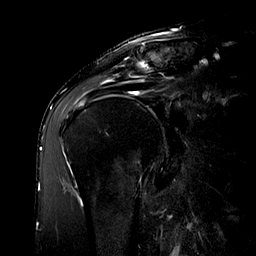
[im 11/17]
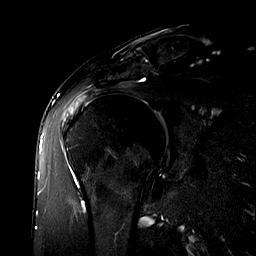
[im 14/17]
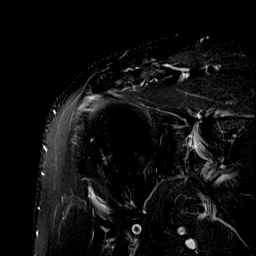
[im 17/17]
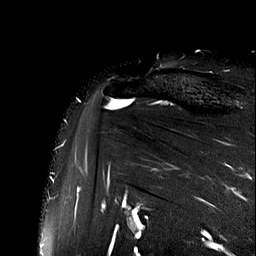

[Series 10: PD · oblique · 4.0mm · 0.55mm/px · 7 of 17 slices shown]
[im 1/17]
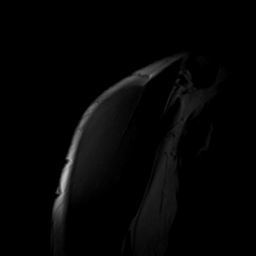
[im 3/17]
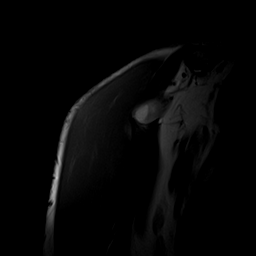
[im 6/17]
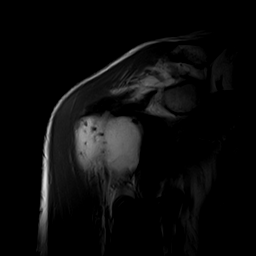
[im 9/17]
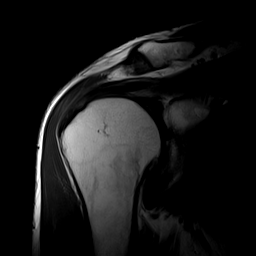
[im 11/17]
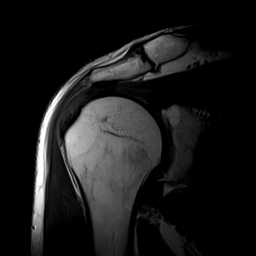
[im 14/17]
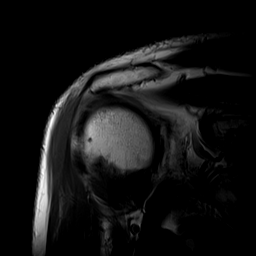
[im 17/17]
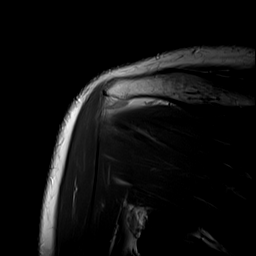

[40 of 40 positions shown; findings below may reference images not displayed]

FINDINGS: Rotator cuff: Moderate supraspinatus and infraspinatus tendinopathy
with considerable fissuring in the infraspinatus tendon and
partial-thickness bursal surface tearing. Partial thickness bursal
surface tear of the distal supraspinatus on image [DATE]. No
well-defined full-thickness tear is identified.

Muscles:  Unremarkable

Biceps long head:  Unremarkable

Acromioclavicular Joint: Mild spurring and minimal degenerative
subcortical marrow edema. Type II acromion. Mild subacromial
subdeltoid bursitis.

Glenohumeral Joint: Mild to moderate degenerative chondral thinning
with associated spurring. No joint effusion. Coracohumeral ligament
and inferior glenohumeral ligament unremarkable.

Labrum: Mild linear accentuated signal at the chondrolabral junction
of the posterior labrum on images 12 through 15 series 4.

Bones: No significant extra-articular osseous abnormalities
identified.

Other: No supplemental non-categorized findings.
IMPRESSION: 1. Partial thickness bursal surface tearing and some fissuring in
the infraspinatus tendon. Although a definite full-thickness tear is
not well seen, given the degree of fissuring a pinpoint
full-thickness connection is difficult to exclude. Conventional or
MR arthrography could be utilized to further assess if clinically
warranted.
2. Partial-thickness bursal surface tearing and tendinopathy in the
supraspinatus tendon is also present.
3. Mild subacromial subdeltoid bursitis.
4. Mild to moderate degenerative glenohumeral arthropathy with mild
degenerative AC joint arthropathy.
5. There is linear increased signal at the chondrolabral junction of
the posterior labrum suspicious for a small labral tear.

## 2019-07-23 ENCOUNTER — Telehealth: Payer: Self-pay

## 2019-07-23 NOTE — Telephone Encounter (Signed)
PA for lamictal 150 mg has been submitted to [facility].  Fax # [phone]. Confirmation has been received.

## 2019-07-24 NOTE — Telephone Encounter (Signed)
PA for Lamictal 150 mg has been approved.  Approval dates 07/23/2019-07/22/2020.

## 2019-09-02 ENCOUNTER — Encounter: Payer: Self-pay | Admitting: Family Medicine

## 2019-09-02 NOTE — Telephone Encounter (Signed)
Health Maintenance request faxed to [facility] @ [phone]. Awaiting records.

## 2019-11-03 ENCOUNTER — Ambulatory Visit (INDEPENDENT_AMBULATORY_CARE_PROVIDER_SITE_OTHER): Payer: BC Managed Care – PPO | Admitting: Family Medicine

## 2019-11-03 ENCOUNTER — Encounter: Payer: Self-pay | Admitting: Family Medicine

## 2019-11-03 ENCOUNTER — Other Ambulatory Visit: Payer: Self-pay

## 2019-11-03 VITALS — BP 120/70 | HR 62 | Temp 98.3°F | Resp 18 | Ht 74.0 in | Wt 193.4 lb

## 2019-11-03 DIAGNOSIS — G40309 Generalized idiopathic epilepsy and epileptic syndromes, not intractable, without status epilepticus: Secondary | ICD-10-CM | POA: Diagnosis not present

## 2019-11-03 DIAGNOSIS — R350 Frequency of micturition: Secondary | ICD-10-CM

## 2019-11-03 DIAGNOSIS — Z0000 Encounter for general adult medical examination without abnormal findings: Secondary | ICD-10-CM | POA: Diagnosis not present

## 2019-11-03 DIAGNOSIS — R351 Nocturia: Secondary | ICD-10-CM

## 2019-11-03 LAB — COMPREHENSIVE METABOLIC PANEL
ALT: 18 U/L (ref 0–53)
AST: 25 U/L (ref 0–37)
Albumin: 4.6 g/dL (ref 3.5–5.2)
Alkaline Phosphatase: 56 U/L (ref 39–117)
BUN: 13 mg/dL (ref 6–23)
CO2: 30 mEq/L (ref 19–32)
Calcium: 9.5 mg/dL (ref 8.4–10.5)
Chloride: 105 mEq/L (ref 96–112)
Creatinine, Ser: 0.87 mg/dL (ref 0.40–1.50)
GFR: 90.98 mL/min (ref >60.00)
Glucose, Bld: 96 mg/dL (ref 70–99)
Potassium: 4.3 mEq/L (ref 3.5–5.1)
Sodium: 141 mEq/L (ref 135–145)
Total Bilirubin: 0.8 mg/dL (ref 0.2–1.2)
Total Protein: 6.7 g/dL (ref 6.0–8.3)

## 2019-11-03 LAB — CBC
HCT: 40.7 % (ref 39.0–52.0)
Hemoglobin: 13.9 g/dL (ref 13.0–17.0)
MCHC: 34.3 g/dL (ref 30.0–36.0)
MCV: 93.2 fl (ref 78.0–100.0)
Platelets: 203 10*3/uL (ref 150.0–400.0)
RBC: 4.36 Mil/uL (ref 4.22–5.81)
RDW: 12.8 % (ref 11.5–15.5)
WBC: 4.4 10*3/uL (ref 4.0–10.5)

## 2019-11-03 LAB — URINALYSIS
Bilirubin Urine: NEGATIVE
Hgb urine dipstick: NEGATIVE
Ketones, ur: NEGATIVE
Leukocytes,Ua: NEGATIVE
Nitrite: NEGATIVE
Specific Gravity, Urine: 1.01 (ref 1.000–1.030)
Total Protein, Urine: NEGATIVE
Urine Glucose: NEGATIVE
Urobilinogen, UA: 0.2 (ref 0.0–1.0)
pH: 6.5 (ref 5.0–8.0)

## 2019-11-03 LAB — LIPID PANEL
Cholesterol: 139 mg/dL (ref 0–200)
HDL: 48.2 mg/dL (ref >39.00)
LDL Cholesterol: 78 mg/dL (ref 0–99)
NonHDL: 90.96
Total CHOL/HDL Ratio: 3
Triglycerides: 65 mg/dL (ref 0.0–149.0)
VLDL: 13 mg/dL (ref 0.0–40.0)

## 2019-11-03 LAB — PSA: PSA: 0.78 ng/mL (ref 0.10–4.00)

## 2019-11-03 LAB — TSH: TSH: 0.82 u[IU]/mL (ref 0.35–4.50)

## 2019-11-03 NOTE — Progress Notes (Signed)
Subjective:    Patient ID: Cristian [name], male    DOB: Nov 27, 1965, 54 y.o.   MRN: [id number]  Chief Complaint  Patient presents with  . Annual Exam    HPI Patient is in today for annual preventative exam. No recent febrile illness or hospitalizations. He is staying active and eating well. Is exercising from home and managing quarantine well. Denies CP/palp/SOB/HA/congestion/fevers/GI or GU c/o. Taking meds as prescribed  Past Medical History:  Diagnosis Date  . AVM (arteriovenous malformation) brain 10/05/2014   Follows with Dr Anne Hahn at [facility]  . Cerebral AVM    Right occipito-temporal  . 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 Migraine   . Preventative health care 10/05/2014  . Seizure [facility])     Past Surgical History:  Procedure Laterality Date  . arthoscopic knee Bilateral 2016   Dr. Shelle Iron  . ROTATOR CUFF REPAIR Left 2017    Family History  Problem Relation Age of Onset  . Stroke Paternal Grandfather   . Seizures Neg Hx     Social History   Socioeconomic History  . Marital status: Married    Spouse name: Not on file  . Number of children: 1  . Years of education: PhD  . Highest education level: Not on file  Occupational History  . Occupation: [employer]  Tobacco Use  . Smoking status: Never Smoker  . Smokeless tobacco: Never Used  Substance and Sexual Activity  . Alcohol use: Yes    Comment: 1 beverage daily  . Drug use: No  . Sexual activity: Yes    Comment: lives with wife and daughter, dog. no major dietary restrictions. Engineer, building services at [employer], wears seat belt daily  Other Topics Concern  . Not on file  Social History Narrative   Lives at home, married, travels for work   Patient is right handed.   Patient drinks 4 cups daily.   Social Determinants of Health   Financial Resource Strain:   . Difficulty of Paying Living Expenses: Not on file  Food Insecurity:   . Worried About Patent examiner in the Last Year: Not on file  . Ran Out of Food in the Last Year: Not on file  Transportation Needs:   . Lack of Transportation (Medical): Not on file  . Lack of Transportation (Non-Medical): Not on file  Physical Activity:   . Days of Exercise per Week: Not on file  . Minutes of Exercise per Session: Not on file  Stress:   . Feeling of Stress : Not on file  Social Connections:   . Frequency of Communication with Friends and Family: Not on file  . Frequency of Social Gatherings with Friends and Family: Not on file  . Attends Religious Services: Not on file  . Active Member of Clubs or Organizations: Not on file  . Attends Banker Meetings: Not on file  . Marital Status: Not on file  Intimate Partner Violence:   . Fear of Current or Ex-Partner: Not on file  . Emotionally Abused: Not on file  . Physically Abused: Not on file  . Sexually Abused: Not on file    Outpatient Medications Prior to Visit  Medication Sig Dispense Refill  . LAMICTAL 150 MG tablet Take 1 tablet (150 mg total) by mouth 2 (two) times daily. 180 tablet 3  . LAMICTAL 200 MG tablet Take 1 tablet (200 mg total) by mouth 2 (two) times daily. 180 tablet  3  . levETIRAcetam (KEPPRA) 750 MG tablet TAKE 2 TABLETS (1,500MG     TOTAL) TWICE A DAY 360 tablet 3  . rizatriptan (MAXALT-MLT) 10 MG disintegrating tablet Take 1 tablet (10 mg total) by mouth as needed for migraine. May repeat in 2 hours if needed 27 tablet 1   No facility-administered medications prior to visit.    No Known Allergies  Review of Systems  Constitutional: Negative for chills, fever and malaise/fatigue.  HENT: Negative for congestion and hearing loss.   Eyes: Negative for discharge.  Respiratory: Negative for cough, sputum production and shortness of breath.   Cardiovascular: Negative for chest pain, palpitations and leg swelling.  Gastrointestinal: Negative for abdominal pain, blood in stool, constipation, diarrhea,  heartburn, nausea and vomiting.  Genitourinary: Negative for dysuria, frequency, hematuria and urgency.  Musculoskeletal: Negative for back pain, falls and myalgias.  Skin: Negative for rash.  Neurological: Negative for dizziness, sensory change, loss of consciousness, weakness and headaches.  Endo/Heme/Allergies: Negative for environmental allergies. Does not bruise/bleed easily.  Psychiatric/Behavioral: Negative for depression and suicidal ideas. The patient is not nervous/anxious and does not have insomnia.        Objective:    Physical Exam Vitals and nursing note reviewed.  Constitutional:      General: He is not in acute distress.    Appearance: Normal appearance. He is well-developed. He is not ill-appearing.  HENT:     Head: Normocephalic and atraumatic.     Right Ear: Tympanic membrane, ear canal and external ear normal. There is no impacted cerumen.     Left Ear: Tympanic membrane, ear canal and external ear normal. There is no impacted cerumen.  Eyes:     General: No scleral icterus.       Right eye: No discharge.        Left eye: No discharge.     Extraocular Movements: Extraocular movements intact.     Conjunctiva/sclera: Conjunctivae normal.     Pupils: Pupils are equal, round, and reactive to light.  Cardiovascular:     Rate and Rhythm: Normal rate and regular rhythm.     Pulses: Normal pulses.     Heart sounds: Normal heart sounds. No murmur.  Pulmonary:     Effort: Pulmonary effort is normal.     Breath sounds: Normal breath sounds. No wheezing.  Abdominal:     General: Bowel sounds are normal. There is no distension.     Palpations: Abdomen is soft. There is no mass.     Tenderness: There is no abdominal tenderness. There is no guarding.  Musculoskeletal:        General: No tenderness. Normal range of motion.     Cervical back: Normal range of motion and neck supple.     Right lower leg: No edema.     Left lower leg: No edema.  Skin:    General: Skin is  warm and dry.  Neurological:     General: No focal deficit present.     Mental Status: He is alert and oriented to person, place, and time.     Gait: Gait normal.     Deep Tendon Reflexes: Reflexes normal.  Psychiatric:        Mood and Affect: Mood normal.        Thought Content: Thought content normal.     BP 120/70 (BP Location: Left Arm, Patient Position: Sitting, Cuff Size: Normal)   Pulse 62   Temp 98.3 F (36.8 C) (Temporal)  Resp 18   Ht 6\' 2"  (1.88 m)   Wt 193 lb 6.4 oz (87.7 kg)   SpO2 97%   BMI 24.83 kg/m  Wt Readings from Last 3 Encounters:  11/03/19 193 lb 6.4 oz (87.7 kg)  03/05/18 187 lb (84.8 kg)  10/22/17 192 lb (87.1 kg)    Diabetic Foot Exam - Simple   No data filed     Lab Results  Component Value Date   WBC 4.2 03/05/2018   HGB 13.9 03/05/2018   HCT 40.7 03/05/2018   PLT 197 03/05/2018   GLUCOSE 94 03/05/2018   CHOL 128 10/22/2017   TRIG 58.0 10/22/2017   HDL 45.10 10/22/2017   LDLCALC 71 10/22/2017   ALT 21 03/05/2018   AST 23 03/05/2018   NA 143 03/05/2018   K 4.4 03/05/2018   CL 103 03/05/2018   CREATININE 0.82 03/05/2018   BUN 20 03/05/2018   CO2 24 03/05/2018   TSH 1.38 10/22/2017   PSA 0.83 10/22/2017    Lab Results  Component Value Date   TSH 1.38 10/22/2017   Lab Results  Component Value Date   WBC 4.2 03/05/2018   HGB 13.9 03/05/2018   HCT 40.7 03/05/2018   MCV 92 03/05/2018   PLT 197 03/05/2018   Lab Results  Component Value Date   NA 143 03/05/2018   K 4.4 03/05/2018   CO2 24 03/05/2018   GLUCOSE 94 03/05/2018   BUN 20 03/05/2018   CREATININE 0.82 03/05/2018   BILITOT 0.6 03/05/2018   ALKPHOS 68 03/05/2018   AST 23 03/05/2018   ALT 21 03/05/2018   PROT 6.7 03/05/2018   ALBUMIN 4.7 03/05/2018   CALCIUM 9.1 03/05/2018   GFR 97.44 10/22/2017   Lab Results  Component Value Date   CHOL 128 10/22/2017   Lab Results  Component Value Date   HDL 45.10 10/22/2017   Lab Results  Component Value Date    LDLCALC 71 10/22/2017   Lab Results  Component Value Date   TRIG 58.0 10/22/2017   Lab Results  Component Value Date   CHOLHDL 3 10/22/2017   No results found for: HGBA1C     Assessment & Plan:   Problem List Items Addressed This Visit    Generalized convulsive epilepsy ([place]) (Chronic)    Continues to follow with neurology and doing well.       Preventative health care - Primary    Patient encouraged to maintain heart healthy diet, regular exercise, adequate sleep. Consider daily probiotics. Take medications as prescribed. Labs ordered and reviewed.       Relevant Orders   CBC   Comprehensive metabolic panel   Lipid panel   PSA   TSH   Nocturia    Up just twice a night most nights. Notes some dribbling at the end or urination.       Other Visit Diagnoses    Urinary frequency       Relevant Orders   PSA   Urine Culture   Urinalysis      I am having Lequita Asal maintain his LaMICtal, LaMICtal, levETIRAcetam, and rizatriptan.  No orders of the defined types were placed in this encounter.    Penni Homans, MD

## 2019-11-03 NOTE — Assessment & Plan Note (Signed)
Patient encouraged to maintain heart healthy diet, regular exercise, adequate sleep. Consider daily probiotics. Take medications as prescribed. Labs ordered and reviewed 

## 2019-11-03 NOTE — Patient Instructions (Addendum)
60-80 ounces of fluids daily    Preventive Care 28-56 Years Old, Male Preventive care refers to lifestyle choices and visits with your health care provider that can promote health and wellness. This includes:  A yearly physical exam. This is also called an annual well check.  Regular dental and eye exams.  Immunizations.  Screening for certain conditions.  Healthy lifestyle choices, such as eating a healthy diet, getting regular exercise, not using drugs or products that contain nicotine and tobacco, and limiting alcohol use. What can I expect for my preventive care visit? Physical exam Your health care provider will check:  Height and weight. These may be used to calculate body mass index (BMI), which is a measurement that tells if you are at a healthy weight.  Heart rate and blood pressure.  Your skin for abnormal spots. Counseling Your health care provider may ask you questions about:  Alcohol, tobacco, and drug use.  Emotional well-being.  Home and relationship well-being.  Sexual activity.  Eating habits.  Work and work Statistician. What immunizations do I need?  Influenza (flu) vaccine  This is recommended every year. Tetanus, diphtheria, and pertussis (Tdap) vaccine  You may need a Td booster every 10 years. Varicella (chickenpox) vaccine  You may need this vaccine if you have not already been vaccinated. Zoster (shingles) vaccine  You may need this after age 43. Measles, mumps, and rubella (MMR) vaccine  You may need at least one dose of MMR if you were born in 1957 or later. You may also need a second dose. Pneumococcal conjugate (PCV13) vaccine  You may need this if you have certain conditions and were not previously vaccinated. Pneumococcal polysaccharide (PPSV23) vaccine  You may need one or two doses if you smoke cigarettes or if you have certain conditions. Meningococcal conjugate (MenACWY) vaccine  You may need this if you have certain  conditions. Hepatitis A vaccine  You may need this if you have certain conditions or if you travel or work in places where you may be exposed to hepatitis A. Hepatitis B vaccine  You may need this if you have certain conditions or if you travel or work in places where you may be exposed to hepatitis B. Haemophilus influenzae type b (Hib) vaccine  You may need this if you have certain risk factors. Human papillomavirus (HPV) vaccine  If recommended by your health care provider, you may need three doses over 6 months. You may receive vaccines as individual doses or as more than one vaccine together in one shot (combination vaccines). Talk with your health care provider about the risks and benefits of combination vaccines. What tests do I need? Blood tests  Lipid and cholesterol levels. These may be checked every 5 years, or more frequently if you are over 48 years old.  Hepatitis C test.  Hepatitis B test. Screening  Lung cancer screening. You may have this screening every year starting at age 48 if you have a 30-pack-year history of smoking and currently smoke or have quit within the past 15 years.  Prostate cancer screening. Recommendations will vary depending on your family history and other risks.  Colorectal cancer screening. All adults should have this screening starting at age 56 and continuing until age 69. Your health care provider may recommend screening at age 81 if you are at increased risk. You will have tests every 1-10 years, depending on your results and the type of screening test.  Diabetes screening. This is done by checking your  blood sugar (glucose) after you have not eaten for a while (fasting). You may have this done every 1-3 years.  Sexually transmitted disease (STD) testing. Follow these instructions at home: Eating and drinking  Eat a diet that includes fresh fruits and vegetables, whole grains, lean protein, and low-fat dairy products.  Take vitamin and  mineral supplements as recommended by your health care provider.  Do not drink alcohol if your health care provider tells you not to drink.  If you drink alcohol: ? Limit how much you have to 0-2 drinks a day. ? Be aware of how much alcohol is in your drink. In the [place] one drink equals one 12 oz bottle of beer (355 mL), one 5 oz glass of wine (148 mL), or one 1 oz glass of hard liquor (44 mL). Lifestyle  Take daily care of your teeth and gums.  Stay active. Exercise for at least 30 minutes on 5 or more days each week.  Do not use any products that contain nicotine or tobacco, such as cigarettes, e-cigarettes, and chewing tobacco. If you need help quitting, ask your health care provider.  If you are sexually active, practice safe sex. Use a condom or other form of protection to prevent STIs (sexually transmitted infections).  Talk with your health care provider about taking a low-dose aspirin every day starting at age 110. What's next?  Go to your health care provider once a year for a well check visit.  Ask your health care provider how often you should have your eyes and teeth checked.  Stay up to date on all vaccines. This information is not intended to replace advice given to you by your health care provider. Make sure you discuss any questions you have with your health care provider. Document Revised: 09/26/2018 Document Reviewed: 09/26/2018 Elsevier Patient Education  2020 [employer].

## 2019-11-03 NOTE — Assessment & Plan Note (Signed)
Continues to follow with neurology and doing well.  

## 2019-11-03 NOTE — Assessment & Plan Note (Signed)
Up just twice a night most nights. Notes some dribbling at the end or urination.

## 2019-11-04 LAB — URINE CULTURE
MICRO NUMBER:: 10052246
Result:: NO GROWTH
SPECIMEN QUALITY:: ADEQUATE

## 2019-12-22 ENCOUNTER — Encounter: Payer: Self-pay | Admitting: Family Medicine

## 2020-03-16 ENCOUNTER — Encounter: Payer: Self-pay | Admitting: Neurology

## 2020-03-16 ENCOUNTER — Other Ambulatory Visit: Payer: Self-pay

## 2020-03-16 ENCOUNTER — Ambulatory Visit (INDEPENDENT_AMBULATORY_CARE_PROVIDER_SITE_OTHER): Payer: BC Managed Care – PPO | Admitting: Neurology

## 2020-03-16 VITALS — BP 112/60 | HR 53 | Ht 74.0 in | Wt 190.0 lb

## 2020-03-16 DIAGNOSIS — G40309 Generalized idiopathic epilepsy and epileptic syndromes, not intractable, without status epilepticus: Secondary | ICD-10-CM

## 2020-03-16 DIAGNOSIS — Q282 Arteriovenous malformation of cerebral vessels: Secondary | ICD-10-CM | POA: Diagnosis not present

## 2020-03-16 MED ORDER — LAMICTAL 150 MG PO TABS: 150.0000 mg | ORAL_TABLET | Freq: Two times a day (BID) | ORAL | 3 refills | Status: DC

## 2020-03-16 MED ORDER — LAMICTAL 200 MG PO TABS: 200.0000 mg | ORAL_TABLET | Freq: Two times a day (BID) | ORAL | 3 refills | Status: DC

## 2020-03-16 MED ORDER — LEVETIRACETAM 750 MG PO TABS: ORAL_TABLET | ORAL | 3 refills | Status: DC

## 2020-03-16 NOTE — Patient Instructions (Signed)
Check the blood work Continue current medications  See you back in 1 year or sooner if needed

## 2020-03-16 NOTE — Progress Notes (Signed)
PATIENT: Suanne Marker DOB: June 10, 1966  REASON FOR VISIT: follow up HISTORY FROM: patient  HISTORY OF PRESENT ILLNESS: Today 03/16/20  Mr. Messmer is a 54 year old male with history of large occipital temporal AVM with associated seizure disorder.  Last seizure occurred in October 2020, prior to that was in April 2018, provoked by severe diarrhea.  For recent seizure, no identifiable triggers, was compliant with medications.  He remains on brand-name Lamictal (pays about $1500 for 3 month supply), generic Keppra.  Before seizure will experience pink and green flashing lights, about 15 minutes later will lose consciousness, full body shaking.  Afterwards, will wake up somewhat confused.  No change in medication he can identify.  He lives in [place], is gainfully employed, as a IT trainer.  Headaches are well controlled, will occasionally take Maxalt.  Presents today for follow-up unaccompanied.  HISTORY 03/12/2019 Dr. Jannifer Franklin: Carvel Huskins is a 54 year old right-handed white male with a history of a large right occipital-temporal AVM with associated seizure disorder.  Last seizure was in April 2018, the patient is doing well on the combination of Lamictal and Keppra.  The patient has tolerated the medications well.  He reports occasional migraine headaches, he will take Maxalt for this.  He is operating a motor vehicle without difficulty.  He reports no other new medical issues that have come up since last seen.   REVIEW OF SYSTEMS: Out of a complete 14 system review of symptoms, the patient complains only of the following symptoms, and all other reviewed systems are negative.  Seizure  ALLERGIES: No Known Allergies  HOME MEDICATIONS: Outpatient Medications Prior to Visit  Medication Sig Dispense Refill  . rizatriptan (MAXALT-MLT) 10 MG disintegrating tablet Take 1 tablet (10 mg total) by mouth as needed for migraine. May repeat in 2 hours if needed 27 tablet 1  .  LAMICTAL 150 MG tablet Take 1 tablet (150 mg total) by mouth 2 (two) times daily. 180 tablet 3  . LAMICTAL 200 MG tablet Take 1 tablet (200 mg total) by mouth 2 (two) times daily. 180 tablet 3  . levETIRAcetam (KEPPRA) 750 MG tablet TAKE 2 TABLETS (1,500MG     TOTAL) TWICE A DAY 360 tablet 3   No facility-administered medications prior to visit.    PAST MEDICAL HISTORY: Past Medical History:  Diagnosis Date  . AVM (arteriovenous malformation) brain 10/05/2014   Follows with Dr Jannifer Franklin at [facility]  . Cerebral AVM    Right occipito-temporal  . 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 Migraine   . Preventative health care 10/05/2014  . Seizure ([place])     PAST SURGICAL HISTORY: Past Surgical History:  Procedure Laterality Date  . arthoscopic knee Bilateral 2016   Dr. Tonita Cong  . ROTATOR CUFF REPAIR Left 2017    FAMILY HISTORY: Family History  Problem Relation Age of Onset  . Stroke Paternal Grandfather   . Seizures Neg Hx     SOCIAL HISTORY: Social History   Socioeconomic History  . Marital status: Married    Spouse name: Not on file  . Number of children: 1  . Years of education: PhD  . Highest education level: Not on file  Occupational History  . Occupation: [employer]  Tobacco Use  . Smoking status: Never Smoker  . Smokeless tobacco: Never Used  Substance and Sexual Activity  . Alcohol use: Yes    Comment: 1 beverage daily  . Drug use: No  . Sexual activity:  Yes    Comment: lives with wife and daughter, dog. no major dietary restrictions. Engineer, building services at [employer], wears seat belt daily  Other Topics Concern  . Not on file  Social History Narrative   Lives at home, married, travels for work   Patient is right handed.   Patient drinks 4 cups daily.   Social Determinants of Health   Financial Resource Strain:   . Difficulty of Paying Living Expenses:   Food Insecurity:   . Worried About Programme researcher, broadcasting/film/video in the  Last Year:   . Barista in the Last Year:   Transportation Needs:   . Freight forwarder (Medical):   Marland Kitchen Lack of Transportation (Non-Medical):   Physical Activity:   . Days of Exercise per Week:   . Minutes of Exercise per Session:   Stress:   . Feeling of Stress :   Social Connections:   . Frequency of Communication with Friends and Family:   . Frequency of Social Gatherings with Friends and Family:   . Attends Religious Services:   . Active Member of Clubs or Organizations:   . Attends Banker Meetings:   Marland Kitchen Marital Status:   Intimate Partner Violence:   . Fear of Current or Ex-Partner:   . Emotionally Abused:   Marland Kitchen Physically Abused:   . Sexually Abused:    PHYSICAL EXAM  Vitals:   03/16/20 1440  BP: 112/60  Pulse: (!) 53  Weight: 190 lb (86.2 kg)  Height: 6\' 2"  (1.88 m)   Body mass index is 24.39 kg/m.  Generalized: Well developed, in no acute distress   Neurological examination  Mentation: Alert oriented to time, place, history taking. Follows all commands speech and language fluent Cranial nerve II-XII: Pupils were equal round reactive to light. Extraocular movements were full, visual field were full on confrontational test. Facial sensation and strength were normal.  Head turning and shoulder shrug  were normal and symmetric. Motor: The motor testing reveals 5 over 5 strength of all 4 extremities. Good symmetric motor tone is noted throughout.  Sensory: Sensory testing is intact to soft touch on all 4 extremities. No evidence of extinction is noted.  Coordination: Cerebellar testing reveals good finger-nose-finger and heel-to-shin bilaterally.  Gait and station: Gait is normal. Tandem gait is normal.   Reflexes: Deep tendon reflexes are symmetric and normal bilaterally.   DIAGNOSTIC DATA (LABS, IMAGING, TESTING) - I reviewed patient records, labs, notes, testing and imaging myself where available.  Lab Results  Component Value Date   WBC  4.4 11/03/2019   HGB 13.9 11/03/2019   HCT 40.7 11/03/2019   MCV 93.2 11/03/2019   PLT 203.0 11/03/2019      Component Value Date/Time   NA 141 11/03/2019 1125   NA 143 03/05/2018 1203   K 4.3 11/03/2019 1125   CL 105 11/03/2019 1125   CO2 30 11/03/2019 1125   GLUCOSE 96 11/03/2019 1125   BUN 13 11/03/2019 1125   BUN 20 03/05/2018 1203   CREATININE 0.87 11/03/2019 1125   CREATININE 0.90 10/02/2014 1643   CALCIUM 9.5 11/03/2019 1125   PROT 6.7 11/03/2019 1125   PROT 6.7 03/05/2018 1203   ALBUMIN 4.6 11/03/2019 1125   ALBUMIN 4.7 03/05/2018 1203   AST 25 11/03/2019 1125   ALT 18 11/03/2019 1125   ALKPHOS 56 11/03/2019 1125   BILITOT 0.8 11/03/2019 1125   BILITOT 0.6 03/05/2018 1203   GFRNONAA 101 03/05/2018 1203  GFRAA 117 03/05/2018 1203   Lab Results  Component Value Date   CHOL 139 11/03/2019   HDL 48.20 11/03/2019   LDLCALC 78 11/03/2019   TRIG 65.0 11/03/2019   CHOLHDL 3 11/03/2019   No results found for: HGBA1C No results found for: VITAMINB12 Lab Results  Component Value Date   TSH 0.82 11/03/2019    ASSESSMENT AND PLAN 54 y.o. year old male  has a past medical history of AVM (arteriovenous malformation) brain (10/05/2014), Cerebral AVM, Generalized convulsive epilepsy without mention of intractable epilepsy (10/01/2013), Migraine, Preventative health care (10/05/2014), and Seizure ([facility]). here with:  1.  History of seizures 2.  Right occipital-temporal AVM 3.  Migraine headache  Mr. Bracco had a recent seizure in October 2020 (consistent with typical seizure presentation), no identifiable triggers or noncompliance of medication. Today, I will check blood work, along with Keppra and Lamictal level.  He is on fairly high doses of the medication, Lamictal 350 mg BID, Keppra 1500 mg BID.  Once labs results, we will adjust the medication if needed, possibly increase Lamictal if allowable, need to consider patient pays $ 1500 for 3 month supply of brand name  Lamictal.  He will follow-up in 1 year or sooner if needed, call for recurrent seizure.  I spent 30 minutes of face-to-face and non-face-to-face time with patient.  This included previsit chart review, lab review, study review, order entry, electronic health record documentation, patient education.  Margie Ege, AGNP-C, DNP 03/16/2020, 3:15 PM [facility] [address] [place], [place] [address] [phone]

## 2020-03-17 LAB — CBC WITH DIFFERENTIAL/PLATELET
Basophils Absolute: 0 10*3/uL (ref 0.0–0.2)
Basos: 0 %
EOS (ABSOLUTE): 0 10*3/uL (ref 0.0–0.4)
Eos: 1 %
Hematocrit: 41.4 % (ref 37.5–51.0)
Hemoglobin: 14 g/dL (ref 13.0–17.7)
Immature Grans (Abs): 0 10*3/uL (ref 0.0–0.1)
Immature Granulocytes: 0 %
Lymphocytes Absolute: 1.4 10*3/uL (ref 0.7–3.1)
Lymphs: 26 %
MCH: 32.2 pg (ref 26.6–33.0)
MCHC: 33.8 g/dL (ref 31.5–35.7)
MCV: 95 fL (ref 79–97)
Monocytes Absolute: 0.5 10*3/uL (ref 0.1–0.9)
Monocytes: 9 %
Neutrophils Absolute: 3.4 10*3/uL (ref 1.4–7.0)
Neutrophils: 64 %
Platelets: 186 10*3/uL (ref 150–450)
RBC: 4.35 x10E6/uL (ref 4.14–5.80)
RDW: 12.3 % (ref 11.6–15.4)
WBC: 5.3 10*3/uL (ref 3.4–10.8)

## 2020-03-17 LAB — COMPREHENSIVE METABOLIC PANEL
ALT: 23 IU/L (ref 0–44)
AST: 27 IU/L (ref 0–40)
Albumin/Globulin Ratio: 2.8 — ABNORMAL HIGH (ref 1.2–2.2)
Albumin: 4.8 g/dL (ref 3.8–4.9)
Alkaline Phosphatase: 68 IU/L (ref 48–121)
BUN/Creatinine Ratio: 17 (ref 9–20)
BUN: 17 mg/dL (ref 6–24)
Bilirubin Total: 0.5 mg/dL (ref 0.0–1.2)
CO2: 26 mmol/L (ref 20–29)
Calcium: 9.6 mg/dL (ref 8.7–10.2)
Chloride: 103 mmol/L (ref 96–106)
Creatinine, Ser: 0.99 mg/dL (ref 0.76–1.27)
GFR calc Af Amer: 99 mL/min/{1.73_m2} (ref >59)
GFR calc non Af Amer: 85 mL/min/{1.73_m2} (ref >59)
Globulin, Total: 1.7 g/dL (ref 1.5–4.5)
Glucose: 93 mg/dL (ref 65–99)
Potassium: 4.4 mmol/L (ref 3.5–5.2)
Sodium: 143 mmol/L (ref 134–144)
Total Protein: 6.5 g/dL (ref 6.0–8.5)

## 2020-03-17 LAB — LAMOTRIGINE LEVEL: Lamotrigine Lvl: 7.8 ug/mL (ref 2.0–20.0)

## 2020-03-17 LAB — LEVETIRACETAM LEVEL: Levetiracetam Lvl: 22.4 ug/mL (ref 10.0–40.0)

## 2020-03-17 NOTE — Progress Notes (Signed)
I have read the note, and I agree with the clinical assessment and plan.  Kynadee Dam K Brixton Schnapp   

## 2020-03-22 ENCOUNTER — Telehealth: Payer: Self-pay | Admitting: Neurology

## 2020-03-22 MED ORDER — RIZATRIPTAN BENZOATE 10 MG PO TBDP: 10.0000 mg | ORAL_TABLET | ORAL | 1 refills | Status: AC | PRN

## 2020-03-22 MED ORDER — LAMICTAL 200 MG PO TABS: 400.0000 mg | ORAL_TABLET | Freq: Two times a day (BID) | ORAL | 3 refills | Status: DC

## 2020-03-22 NOTE — Telephone Encounter (Signed)
I called the patient.  He had a recurrent seizure in October 2020.  At the time was taking Lamictal 350 mg twice a day, Keppra 1500 mg twice a day.  He was compliant.  Laboratory evaluations were normal. Discussed with Dr. Anne Hahn, I will increase Lamictal 400 mg twice daily, patient requires brand name, pays $ 1500 a month for 3 month supply, hopefully being on just the 200 mg tablet, will result in less cost than 200 and 150 mg tablets.  Could also push off Keppra to 2000 mg BID.

## 2020-07-15 ENCOUNTER — Other Ambulatory Visit: Payer: Self-pay | Admitting: Neurology

## 2020-10-20 DIAGNOSIS — N401 Enlarged prostate with lower urinary tract symptoms: Secondary | ICD-10-CM | POA: Diagnosis not present

## 2020-10-20 DIAGNOSIS — R7309 Other abnormal glucose: Secondary | ICD-10-CM | POA: Diagnosis not present

## 2020-10-20 DIAGNOSIS — Q282 Arteriovenous malformation of cerebral vessels: Secondary | ICD-10-CM | POA: Diagnosis not present

## 2020-10-20 DIAGNOSIS — R351 Nocturia: Secondary | ICD-10-CM | POA: Diagnosis not present

## 2020-11-08 ENCOUNTER — Encounter: Payer: BC Managed Care – PPO | Admitting: Family Medicine

## 2021-01-18 ENCOUNTER — Other Ambulatory Visit: Payer: Self-pay | Admitting: Neurology

## 2021-02-11 DIAGNOSIS — N401 Enlarged prostate with lower urinary tract symptoms: Secondary | ICD-10-CM | POA: Diagnosis not present

## 2021-02-11 DIAGNOSIS — Z125 Encounter for screening for malignant neoplasm of prostate: Secondary | ICD-10-CM | POA: Diagnosis not present

## 2021-02-11 DIAGNOSIS — N400 Enlarged prostate without lower urinary tract symptoms: Secondary | ICD-10-CM | POA: Diagnosis not present

## 2021-02-11 DIAGNOSIS — R351 Nocturia: Secondary | ICD-10-CM | POA: Diagnosis not present

## 2021-02-14 ENCOUNTER — Encounter: Payer: Self-pay | Admitting: Neurology

## 2021-02-14 ENCOUNTER — Telehealth: Payer: Self-pay | Admitting: Neurology

## 2021-02-14 DIAGNOSIS — Q283 Other malformations of cerebral vessels: Secondary | ICD-10-CM

## 2021-02-14 DIAGNOSIS — Q282 Arteriovenous malformation of cerebral vessels: Secondary | ICD-10-CM

## 2021-02-14 DIAGNOSIS — H5461 Unqualified visual loss, right eye, normal vision left eye: Secondary | ICD-10-CM

## 2021-02-14 DIAGNOSIS — G40309 Generalized idiopathic epilepsy and epileptic syndromes, not intractable, without status epilepticus: Secondary | ICD-10-CM

## 2021-02-14 NOTE — Telephone Encounter (Signed)
The MRA Head order is wrong it needs to be a MRA Head without contrast.

## 2021-02-14 NOTE — Telephone Encounter (Signed)
I called patient and advised him of appointment tomorrow at [facility] at 8:30. Provided him with address and phone number. Also provided him with my direct number here in case of any issues. Patient was appreciative. I called [employer] back and spoke with [place] to confirm patient's appointment. Faxed referral/notes to [phone].

## 2021-02-14 NOTE — Addendum Note (Signed)
Addended by: Glean Salvo on: 02/14/2021 10:48 AM   Modules accepted: Orders

## 2021-02-14 NOTE — Telephone Encounter (Addendum)
I called pt.  He had seizure last month, no problem (normal for him).  He stated last Saturday had noted black spot in R eye, dark shadow, now blurry, progressive worsening. No headache. He did not seek urgent care, has not seen opthamologist. Since last seen in [facility] 2015).  He is very concerned that he feels like his AVM is changing. I would touch base with SS/NP and see her recommendations.   Last seen 03-2020, has appt this June 2022.

## 2021-02-14 NOTE — Telephone Encounter (Signed)
See my chart message

## 2021-02-14 NOTE — Telephone Encounter (Signed)
Noted  

## 2021-02-14 NOTE — Telephone Encounter (Signed)
Agree with plan, stat eye appointment tomorrow. Will go ahead and order imaging MRI brain with and without contrast, MRA head.  Last imaging was in May 2018, showing large AVM involving the right temporal lobe. Try to get these done next few days, can we check creatinine before contrast. Hopefully know more tomorrow after eye appointment.   Orders Placed This Encounter  Procedures  . MR BRAIN W WO CONTRAST  . MR ANGIO HEAD WO CONTRAST  . Creatinine, Serum  . Ambulatory referral to Ophthalmology   I tried to call him, no answer. Reviewing your note Andrey Campanile, seizure last month. Symptoms started about 9 days ago, black spot to right eye, is now worsening. No headache. Needs to be seen within the next week. Will route to Dr. Anne Hahn, will up date on office appointment soon.

## 2021-02-14 NOTE — Addendum Note (Signed)
Addended by: Guy Begin on: 02/14/2021 01:38 PM   Modules accepted: Orders

## 2021-02-14 NOTE — Telephone Encounter (Signed)
I called the patient.  Patient had a seizure about a month ago but independently of this, about a week ago he began noting an altitudinal defect in the right eye only, not the left.  The inferior portion of the vision is abnormal, the visual changes with respect to the horizontal plane.  It appears that the patient may have an ischemic lesion of the retina, he will be seen an eye doctor tomorrow, he will be getting MRI of the brain as well.  The patient claims that the left eye is unaffected.

## 2021-02-14 NOTE — Addendum Note (Signed)
Addended by: Guy Begin on: 02/14/2021 11:42 AM   Modules accepted: Orders

## 2021-02-14 NOTE — Telephone Encounter (Signed)
Pt called, have had a seizure, having vision loss. Would like a call from the nurse.

## 2021-02-14 NOTE — Telephone Encounter (Signed)
no to the covid questions MR Brain w/wo contrast & MRA Head wo contrast Margie Ege [facility] auth: [id number] (exp. 02/14/21 to 03/15/21). Patient is scheduled at [facility] for 02/15/21 to arrive at 11 AM. Patient is aware of time & day..   Also, he informed me he has been around metal in the past 4 months. Can you put a DG eye orbits in. He is aware to walk into [facility] after he goes to Dr. Hazle Quant office to have the xray done then coming to our office.

## 2021-02-14 NOTE — Telephone Encounter (Signed)
MRI can check I-stat before MRI tomorrow. Ultimately would be most efficient to go to ER (has been 9 days since symptoms onset), but since has not gone, will do what we can with eye doctor appointment tomorrow, and imaging tomorrow urgently. See what we find.

## 2021-02-14 NOTE — Telephone Encounter (Signed)
Spoke to pt after consulting SS/NP and Dr. Pearlean Brownie order opthamology consult and MRI MRA w/wo contrast stat,  Pt lives in [place], (will come to [facility] for eye exam, do MRA same day if able).

## 2021-02-15 ENCOUNTER — Other Ambulatory Visit: Payer: BC Managed Care – PPO

## 2021-02-15 DIAGNOSIS — H33011 Retinal detachment with single break, right eye: Secondary | ICD-10-CM | POA: Diagnosis not present

## 2021-02-15 DIAGNOSIS — H33031 Retinal detachment with giant retinal tear, right eye: Secondary | ICD-10-CM | POA: Diagnosis not present

## 2021-02-15 DIAGNOSIS — H43813 Vitreous degeneration, bilateral: Secondary | ICD-10-CM | POA: Diagnosis not present

## 2021-02-15 DIAGNOSIS — H43393 Other vitreous opacities, bilateral: Secondary | ICD-10-CM | POA: Diagnosis not present

## 2021-02-15 NOTE — Telephone Encounter (Signed)
I returned patient's wife's call and LVM for her to advise that patient's notes were faxed again. Received confirmation fax page.

## 2021-02-15 NOTE — Telephone Encounter (Signed)
Agree with treatment plan, appreciate the timely actions by Margie Ege to get this patient appropriately evaluated.

## 2021-02-15 NOTE — Telephone Encounter (Signed)
Spoke to [employer] at [facility].  [phone]. Dr. Allena Katz seeing patient.  I faxed over last ofv note, phone messages and [facility] 2015 note Dr. Daphine Deutscher for there records.  Dr. Allena Katz is asking for Dr. Anne Hahn to call her at [phone] to review what was found today.

## 2021-02-15 NOTE — Telephone Encounter (Addendum)
[facility] Boneta Lucks) called, was referred to [place] by Dr. Anne Hahn. We need patient's last notes for his appt today. Would like a call from the nurse.  Contact info: [phone]

## 2021-02-15 NOTE — Telephone Encounter (Signed)
The MRI/MRA have been canceled at this time.

## 2021-02-15 NOTE — Telephone Encounter (Signed)
I spoke with Dr. Allena Katz at [facility]. Cristian [name] had an urgent appointment this morning.  He was found to have a retinal detachment of the right eye.  Feel confident the vision issues were coming from the retinal detachment, versus AVM.  He was sent urgently to a retinal specialist.  Will have surgery today or tomorrow.  He will not be able to make his MRI appointments today. These will need to be rescheduled.

## 2021-02-15 NOTE — Telephone Encounter (Signed)
Received voicemail from patient's wife. She states that [facility] does not have the records that were faxed over from our office yesterday. I confirmed that patient's notes were faxed yesterday, and called [facility] and spoke with Aundra Millet to confirm that we have the correct fax number. After our call, I faxed patient's office notes again to [phone].

## 2021-02-16 ENCOUNTER — Encounter: Payer: Self-pay | Admitting: Neurology

## 2021-02-16 DIAGNOSIS — H33011 Retinal detachment with single break, right eye: Secondary | ICD-10-CM | POA: Diagnosis not present

## 2021-02-24 DIAGNOSIS — H43813 Vitreous degeneration, bilateral: Secondary | ICD-10-CM | POA: Diagnosis not present

## 2021-02-24 DIAGNOSIS — H33011 Retinal detachment with single break, right eye: Secondary | ICD-10-CM | POA: Diagnosis not present

## 2021-03-09 ENCOUNTER — Other Ambulatory Visit: Payer: Self-pay | Admitting: Neurology

## 2021-03-15 DIAGNOSIS — H33011 Retinal detachment with single break, right eye: Secondary | ICD-10-CM | POA: Diagnosis not present

## 2021-03-16 ENCOUNTER — Encounter: Payer: Self-pay | Admitting: Neurology

## 2021-03-21 ENCOUNTER — Encounter: Payer: Self-pay | Admitting: Neurology

## 2021-03-22 ENCOUNTER — Ambulatory Visit: Payer: BC Managed Care – PPO | Admitting: Neurology

## 2021-03-22 DIAGNOSIS — H33011 Retinal detachment with single break, right eye: Secondary | ICD-10-CM | POA: Diagnosis not present

## 2021-04-06 DIAGNOSIS — H33011 Retinal detachment with single break, right eye: Secondary | ICD-10-CM | POA: Diagnosis not present

## 2021-04-06 DIAGNOSIS — H43812 Vitreous degeneration, left eye: Secondary | ICD-10-CM | POA: Diagnosis not present

## 2021-04-06 DIAGNOSIS — H59811 Chorioretinal scars after surgery for detachment, right eye: Secondary | ICD-10-CM | POA: Diagnosis not present

## 2021-04-25 ENCOUNTER — Ambulatory Visit (INDEPENDENT_AMBULATORY_CARE_PROVIDER_SITE_OTHER): Payer: BC Managed Care – PPO | Admitting: Neurology

## 2021-04-25 ENCOUNTER — Encounter: Payer: Self-pay | Admitting: Neurology

## 2021-04-25 VITALS — BP 117/71 | HR 53 | Ht 73.0 in | Wt 197.0 lb

## 2021-04-25 DIAGNOSIS — Q282 Arteriovenous malformation of cerebral vessels: Secondary | ICD-10-CM | POA: Diagnosis not present

## 2021-04-25 DIAGNOSIS — G40309 Generalized idiopathic epilepsy and epileptic syndromes, not intractable, without status epilepticus: Secondary | ICD-10-CM

## 2021-04-25 DIAGNOSIS — G43009 Migraine without aura, not intractable, without status migrainosus: Secondary | ICD-10-CM

## 2021-04-25 NOTE — Progress Notes (Signed)
I have read the note, and I agree with the clinical assessment and plan.  Lavonne Kinderman K Zanai Mallari   

## 2021-04-25 NOTE — Patient Instructions (Signed)
Check labs today  Continue current medications  See you back in 1 year  

## 2021-04-25 NOTE — Progress Notes (Signed)
PATIENT: Cristian [name] DOB: 11-Apr-1966  REASON FOR VISIT: follow up HISTORY FROM: patient Primary Neurologist: Dr. 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 Sent my chart message back in May 2022, vision change in right eye.  Found to have a right retinal detachment. Had surgery, was successful, vision is 80% back to normal, with both eyes is 100%. 1 seizure in Feb 2022, usually has every 1.5 years. Seizures are grand mal. Lamictal is 400 mg twice daily (brand name), Keppra 1500 mg twice daily. Living in [place], thinking of establishing with another neurologist in [place]. Before seizures flashing lights aura, 20 minutes before grand mal seizure. Feel terrible for a day or so. Lamictal level was increased back in June 2021. No triggers for recent seizure in Feb 2022. Last MRI of the brain was in 2018, showed minimal change in the AVM compared to 2005, but it is large.  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  Last seizure occurred in October 2020, prior to that was in April 2018, provoked by severe diarrhea.  For recent seizure, no identifiable triggers, was compliant with medications.  He remains on brand-name Lamictal (pays about $1500 for 3 month supply), generic Keppra.  Before seizure will experience pink and green flashing lights, about 15 minutes later will lose consciousness, full body shaking.  Afterwards, will wake up somewhat confused.  No change in medication he can identify.  He lives in [place], is gainfully employed, as a Radio broadcast assistant.  Headaches are well controlled, will occasionally take Maxalt.  Presents today for follow-up unaccompanied.  HISTORY 03/12/2019 Dr. Anne Hahn: Cristian [name] is a 55 year old right-handed white male with a history  of a large right occipital-temporal AVM with associated seizure disorder.  Last seizure was in April 2018, the patient is doing well on the combination of Lamictal and Keppra.  The patient has tolerated the medications well.  He reports occasional migraine headaches, he will take Maxalt for this.  He is operating a motor vehicle without difficulty.  He reports no other new medical issues that have come up since last seen.   REVIEW OF SYSTEMS: Out of a complete 14 system review of symptoms, the patient complains only of the following symptoms, and all other reviewed systems are negative.  Seizure  ALLERGIES: No Known Allergies  HOME MEDICATIONS: Outpatient Medications Prior to Visit  Medication Sig Dispense Refill   LAMICTAL 200 MG tablet TAKE 2 TABLETS TWICE A DAY (THIS IS THE MOST UP TO    DATE REFILL, VOID OTHER ON FILE) 360 tablet 3   levETIRAcetam (KEPPRA) 750 MG tablet TAKE 2 TABLETS TWICE A DAY 360 tablet 3   rizatriptan (MAXALT-MLT) 10 MG disintegrating tablet Take 1 tablet (10 mg total) by mouth as needed for migraine. May repeat in 2 hours if needed 27 tablet 1   No facility-administered medications prior to visit.    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 Shelle Iron   ROTATOR CUFF REPAIR Left 2017    FAMILY HISTORY: Family History  Problem Relation Age of Onset   Stroke Paternal Grandfather    Seizures Neg Hx     SOCIAL HISTORY: Social History   Socioeconomic History   Marital status: Married    Spouse name: Not on file   Number of children: 1   Years  of education: PhD   Highest education level: Not on file  Occupational History   Occupation: [employer]  Tobacco Use   Smoking status: Never   Smokeless tobacco: Never  Vaping Use   Vaping Use: Never used  Substance and Sexual Activity   Alcohol use: Yes    Comment: 1 beverage daily   Drug use: No   Sexual activity: Yes    Comment: lives with wife and daughter, dog. no major dietary restrictions. Engineer, building services at [employer], wears seat belt daily  Other Topics Concern   Not on file  Social History Narrative   Lives at home, married, travels for work   Patient is right handed.   Patient drinks 4 cups daily.   Social Determinants of Health   Financial Resource Strain: Not on file  Food Insecurity: Not on file  Transportation Needs: Not on file  Physical Activity: Not on file  Stress: Not on file  Social Connections: Not on file  Intimate Partner Violence: Not on file   PHYSICAL EXAM  Vitals:   04/25/21 1545  BP: 117/71  Pulse: (!) 53  Weight: 197 lb (89.4 kg)  Height: 6\' 1"  (1.854 m)    Body mass index is 25.99 kg/m.  Generalized: Well developed, in no acute distress  Neurological examination  Mentation: Alert oriented to time, place, history taking. Follows all commands speech and language fluent Cranial nerve II-XII: Pupils were equal round reactive to light. Extraocular movements were full, visual field were full on confrontational test. Facial sensation and strength were normal.  Head turning and shoulder shrug  were normal and symmetric. Motor: The motor testing reveals 5 over 5 strength of all 4 extremities. Good symmetric motor tone is noted throughout.  Sensory: Sensory testing is intact to soft touch on all 4 extremities. No evidence of extinction is noted.  Coordination: Cerebellar testing reveals good finger-nose-finger and heel-to-shin bilaterally.  Gait and station: Gait is normal. Reflexes: Deep tendon reflexes are symmetric and normal bilaterally.    DIAGNOSTIC DATA (LABS, IMAGING, TESTING) - I reviewed patient records, labs, notes, testing and imaging myself where available.  Lab Results  Component Value Date   WBC 5.3 03/16/2020   HGB 14.0 03/16/2020   HCT 41.4 03/16/2020   MCV 95 03/16/2020   PLT 186 03/16/2020      Component Value Date/Time   NA 143 03/16/2020 1518   K 4.4 03/16/2020 1518   CL 103 03/16/2020 1518   CO2 26 03/16/2020 1518   GLUCOSE 93 03/16/2020 1518   GLUCOSE 96 11/03/2019 1125   BUN 17 03/16/2020 1518   CREATININE 0.99 03/16/2020 1518   CREATININE 0.90 10/02/2014 1643   CALCIUM 9.6 03/16/2020 1518   PROT 6.5 03/16/2020 1518   ALBUMIN 4.8 03/16/2020 1518   AST 27 03/16/2020 1518   ALT 23 03/16/2020 1518   ALKPHOS 68 03/16/2020 1518   BILITOT 0.5 03/16/2020 1518   GFRNONAA 85 03/16/2020 1518   GFRAA 99 03/16/2020 1518   Lab Results  Component  Value Date   CHOL 139 11/03/2019   HDL 48.20 11/03/2019   LDLCALC 78 11/03/2019   TRIG 65.0 11/03/2019   CHOLHDL 3 11/03/2019   No results found for: HGBA1C No results found for: VITAMINB12 Lab Results  Component Value Date   TSH 0.82 11/03/2019    ASSESSMENT AND PLAN 55 y.o. year old male  has a past medical history of AVM (arteriovenous malformation) brain (10/05/2014), Cerebral AVM, Generalized convulsive epilepsy without mention of intractable epilepsy (10/01/2013), Migraine, Preventative health care (10/05/2014), and Seizure (HCC). here with:  1.  History of seizures 2.  Right occipital-temporal AVM 3.  Migraine headache 4.  Recent right retinal detachment  -About 6 weeks post right retinal detachment surgery, vision about 80% back to baseline  -Last seizure was in February 2022, typical pattern is seizure every 1.5 years, despite medication -In June 2021 Lamictal was increased to 400 mg twice daily -Will check routine labs today, including drug levels -Would like to obtain MRI imaging for surveillance of the AVM which is large, however  he is considering switching to neurology practice closer to [facility], may pursue imaging at that time, will follow-up on this with him once labs result, make decision about seizure medications, leave as in or consider increase keppra 2000 mg BID, will consult with Dr. Anne Hahn, imaging was cancelled once retinal detachment diagnosis was established  -We have discussed [place] law is no driving until seizure free 6 months -For now, we will go ahead and schedule a follow-up in 1 year  I spent 30 minutes of face-to-face and non-face-to-face time with patient.  This included previsit chart review, lab review, study review, order entry, discussing seizures, medications, management, and follow-up.  Margie Ege, AGNP-C, DNP 04/25/2021, 4:22 PM [facility] [address], [address] [place], [place] [address] [phone]

## 2021-04-28 DIAGNOSIS — R351 Nocturia: Secondary | ICD-10-CM | POA: Diagnosis not present

## 2021-04-28 DIAGNOSIS — N401 Enlarged prostate with lower urinary tract symptoms: Secondary | ICD-10-CM | POA: Diagnosis not present

## 2021-04-29 LAB — COMPREHENSIVE METABOLIC PANEL
ALT: 21 IU/L (ref 0–44)
AST: 23 IU/L (ref 0–40)
Albumin/Globulin Ratio: 2.3 — ABNORMAL HIGH (ref 1.2–2.2)
Albumin: 4.8 g/dL (ref 3.8–4.9)
Alkaline Phosphatase: 70 IU/L (ref 44–121)
BUN/Creatinine Ratio: 17 (ref 9–20)
BUN: 14 mg/dL (ref 6–24)
Bilirubin Total: 0.5 mg/dL (ref 0.0–1.2)
CO2: 27 mmol/L (ref 20–29)
Calcium: 9.6 mg/dL (ref 8.7–10.2)
Chloride: 100 mmol/L (ref 96–106)
Creatinine, Ser: 0.84 mg/dL (ref 0.76–1.27)
Globulin, Total: 2.1 g/dL (ref 1.5–4.5)
Glucose: 104 mg/dL — ABNORMAL HIGH (ref 65–99)
Potassium: 4.4 mmol/L (ref 3.5–5.2)
Sodium: 141 mmol/L (ref 134–144)
Total Protein: 6.9 g/dL (ref 6.0–8.5)
eGFR: 102 mL/min/{1.73_m2} (ref >59)

## 2021-04-29 LAB — CBC WITH DIFFERENTIAL/PLATELET
Basophils Absolute: 0 10*3/uL (ref 0.0–0.2)
Basos: 0 %
EOS (ABSOLUTE): 0.1 10*3/uL (ref 0.0–0.4)
Eos: 2 %
Hematocrit: 39.3 % (ref 37.5–51.0)
Hemoglobin: 13.9 g/dL (ref 13.0–17.7)
Immature Grans (Abs): 0 10*3/uL (ref 0.0–0.1)
Immature Granulocytes: 0 %
Lymphocytes Absolute: 1.3 10*3/uL (ref 0.7–3.1)
Lymphs: 34 %
MCH: 33.1 pg — ABNORMAL HIGH (ref 26.6–33.0)
MCHC: 35.4 g/dL (ref 31.5–35.7)
MCV: 94 fL (ref 79–97)
Monocytes Absolute: 0.4 10*3/uL (ref 0.1–0.9)
Monocytes: 9 %
Neutrophils Absolute: 2.1 10*3/uL (ref 1.4–7.0)
Neutrophils: 55 %
Platelets: 179 10*3/uL (ref 150–450)
RBC: 4.2 x10E6/uL (ref 4.14–5.80)
RDW: 12.5 % (ref 11.6–15.4)
WBC: 3.8 10*3/uL (ref 3.4–10.8)

## 2021-04-29 LAB — LAMOTRIGINE LEVEL: Lamotrigine Lvl: 9.7 ug/mL (ref 2.0–20.0)

## 2021-04-29 LAB — LEVETIRACETAM LEVEL: Levetiracetam Lvl: 18.1 ug/mL (ref 10.0–40.0)

## 2021-05-03 ENCOUNTER — Telehealth: Payer: Self-pay | Admitting: Neurology

## 2021-05-03 DIAGNOSIS — Q283 Other malformations of cerebral vessels: Secondary | ICD-10-CM

## 2021-05-03 NOTE — Telephone Encounter (Signed)
I sent the patient a my chart message, I tried to call earlier:   Cristian, [name] to call you today. Your phone answered but you were in a work meeting. Wanted to follow-up with you about our discussion last week.   I talked with Dr. Anne Hahn, will increase your Keppra to 2000 mg twice daily, blood level will allow. Will keep Lamictal at current dosing.   Okay to hold off on imaging since you will be switching to another neurology practice, I can however order imaging if needed.   Let me know you thoughts and if you agree so I can send in the prescription for Keppra.  Thanks! Maralyn Sago

## 2021-05-04 DIAGNOSIS — H43812 Vitreous degeneration, left eye: Secondary | ICD-10-CM | POA: Diagnosis not present

## 2021-05-04 DIAGNOSIS — H59811 Chorioretinal scars after surgery for detachment, right eye: Secondary | ICD-10-CM | POA: Diagnosis not present

## 2021-05-04 DIAGNOSIS — H33011 Retinal detachment with single break, right eye: Secondary | ICD-10-CM | POA: Diagnosis not present

## 2021-05-04 DIAGNOSIS — H43393 Other vitreous opacities, bilateral: Secondary | ICD-10-CM | POA: Diagnosis not present

## 2021-05-09 ENCOUNTER — Other Ambulatory Visit: Payer: Self-pay | Admitting: Neurology

## 2021-05-09 ENCOUNTER — Encounter: Payer: Self-pay | Admitting: Neurology

## 2021-05-09 MED ORDER — LEVETIRACETAM 500 MG PO TABS: ORAL_TABLET | ORAL | 11 refills | Status: DC

## 2021-05-09 NOTE — Telephone Encounter (Signed)
Can we reach out to his retina surgeon to make sure they have no issue with Cristian [name] having MRI of the brain with and without contrast for surveillance of AVM. Please see the contact info below, once clearance is given, will need to place order for MRI.   Kandace Blitz,  The Retina Surgeon is  Dr Allyne Gee at [facility] [phone])  Have a nice day, Cristian [name])

## 2021-05-09 NOTE — Progress Notes (Signed)
Meds ordered this encounter  Medications   levETIRAcetam (KEPPRA) 500 MG tablet    Sig: Take 3.5 tablets twice daily    Dispense:  220 tablet    Refill:  11    Order Specific Question:   Supervising Provider    Answer:   Levert Feinstein [phone]

## 2021-05-10 ENCOUNTER — Other Ambulatory Visit: Payer: Self-pay | Admitting: *Deleted

## 2021-05-10 MED ORDER — LEVETIRACETAM 500 MG PO TABS: ORAL_TABLET | ORAL | 3 refills | Status: DC

## 2021-05-10 NOTE — Telephone Encounter (Signed)
I called Dr. Allyne Gee office. Carly answered the phone. She spoke to the physician's surgical coordinator, Victorino Dike. She confirmed the patient underwent his eye surgery on 02/15/21. He is okay to proceed with the MRI brain w/wo contrast.

## 2021-05-10 NOTE — Addendum Note (Signed)
Addended by: Glean Salvo on: 05/10/2021 09:50 AM   Modules accepted: Orders

## 2021-05-10 NOTE — Telephone Encounter (Signed)
Thank you Marcelino Duster! MRI brain order placed.   Orders Placed This Encounter  Procedures   MR BRAIN W WO CONTRAST

## 2021-05-11 ENCOUNTER — Other Ambulatory Visit: Payer: Self-pay | Admitting: Neurology

## 2021-05-12 ENCOUNTER — Telehealth: Payer: Self-pay | Admitting: Neurology

## 2021-05-12 NOTE — Telephone Encounter (Signed)
MRI brain w/wo contrast [place] Numbers: [id number] (05/12/21-06/10/21)  Sent to GI for scheduling

## 2021-05-13 ENCOUNTER — Other Ambulatory Visit: Payer: Self-pay | Admitting: Neurology

## 2021-05-13 DIAGNOSIS — Q282 Arteriovenous malformation of cerebral vessels: Secondary | ICD-10-CM

## 2021-05-13 DIAGNOSIS — Q283 Other malformations of cerebral vessels: Secondary | ICD-10-CM

## 2021-05-13 DIAGNOSIS — H5461 Unqualified visual loss, right eye, normal vision left eye: Secondary | ICD-10-CM

## 2021-05-26 ENCOUNTER — Other Ambulatory Visit: Payer: BC Managed Care – PPO

## 2021-05-28 ENCOUNTER — Other Ambulatory Visit: Payer: Self-pay | Admitting: Neurology

## 2021-06-08 DIAGNOSIS — H43393 Other vitreous opacities, bilateral: Secondary | ICD-10-CM | POA: Diagnosis not present

## 2021-06-08 DIAGNOSIS — H33011 Retinal detachment with single break, right eye: Secondary | ICD-10-CM | POA: Diagnosis not present

## 2021-06-08 DIAGNOSIS — H43813 Vitreous degeneration, bilateral: Secondary | ICD-10-CM | POA: Diagnosis not present

## 2021-06-30 ENCOUNTER — Other Ambulatory Visit: Payer: Self-pay

## 2021-06-30 ENCOUNTER — Ambulatory Visit
Admission: RE | Admit: 2021-06-30 | Discharge: 2021-06-30 | Disposition: A | Discharge status: Home / Self Care | Payer: BC Managed Care – PPO | Source: Ambulatory Visit | Attending: Neurology | Admitting: Neurology

## 2021-06-30 DIAGNOSIS — Q283 Other malformations of cerebral vessels: Secondary | ICD-10-CM

## 2021-06-30 DIAGNOSIS — H5461 Unqualified visual loss, right eye, normal vision left eye: Secondary | ICD-10-CM

## 2021-06-30 DIAGNOSIS — Q282 Arteriovenous malformation of cerebral vessels: Secondary | ICD-10-CM

## 2021-06-30 MED ORDER — GADOBENATE DIMEGLUMINE 529 MG/ML IV SOLN
18.0000 mL | Freq: Once | INTRAVENOUS | Status: AC | PRN
  Administered 2021-06-30: 18 mL via INTRAVENOUS

## 2021-07-07 ENCOUNTER — Other Ambulatory Visit: Payer: BC Managed Care – PPO

## 2021-07-27 ENCOUNTER — Encounter: Payer: Self-pay | Admitting: Neurology

## 2021-07-28 ENCOUNTER — Telehealth: Payer: Self-pay | Admitting: Neurology

## 2021-07-28 ENCOUNTER — Other Ambulatory Visit: Payer: Self-pay | Admitting: Neurology

## 2021-07-28 MED ORDER — LAMICTAL 200 MG PO TABS: ORAL_TABLET | ORAL | 3 refills | Status: AC

## 2021-07-28 NOTE — Telephone Encounter (Signed)
I called the patient.  The MRI shows a very large AVM, no changes are noted as compared to 2018.  I indicated that I did refill his prescription for Lamictal.  This was sent to the mail order pharmacy.    MRI brain 06/30/21:  IMPRESSION:  This MRI of the brain without contrast shows the following:  1.   There is a large arteriovenous malformation that involves most of the right temporal lobe and partially involves the right parietal and occipital lobes and the right thalamus.  At the level of the third ventricle, there is 7 mm of right to left shift.  This is essentially unchanged compared to the 2018 MRI.  2.   There are no acute findings.

## 2021-08-17 DIAGNOSIS — H2513 Age-related nuclear cataract, bilateral: Secondary | ICD-10-CM | POA: Diagnosis not present

## 2021-08-17 DIAGNOSIS — H43393 Other vitreous opacities, bilateral: Secondary | ICD-10-CM | POA: Diagnosis not present

## 2021-08-17 DIAGNOSIS — H43813 Vitreous degeneration, bilateral: Secondary | ICD-10-CM | POA: Diagnosis not present

## 2021-08-17 DIAGNOSIS — H59811 Chorioretinal scars after surgery for detachment, right eye: Secondary | ICD-10-CM | POA: Diagnosis not present

## 2021-09-13 ENCOUNTER — Telehealth: Payer: Self-pay | Admitting: Neurology

## 2021-09-13 NOTE — Telephone Encounter (Signed)
Joy with [facility] called stating she faxed over a PA for pt's LAMICTAL 200 MG tablet. Wanting to know if we received it. Key#: Titus Dubin says this is Urgent. Call back number [phone] ext [phone]

## 2021-09-14 NOTE — Telephone Encounter (Addendum)
Attempted to complete case on covermymeds. Kept getting an error message stating eligibility unfound. I called [facility] at [phone] and verbally provided information. VZ#[id number] approved through 09/14/2022.

## 2021-09-24 ENCOUNTER — Other Ambulatory Visit: Payer: Self-pay | Admitting: Neurology

## 2021-10-19 ENCOUNTER — Encounter: Payer: Self-pay | Admitting: Neurology

## 2021-11-08 ENCOUNTER — Encounter: Payer: Self-pay | Admitting: Neurology

## 2021-11-08 ENCOUNTER — Telehealth: Payer: Self-pay | Admitting: Neurology

## 2021-11-08 NOTE — Telephone Encounter (Signed)
FYI pt called in response to a message he received about an earlier appointment to see Judson Roch, NP.  Pt was unable to find more information on my chart as to why he called the office.  Pt asked phone rep to look for an earlier appointment than July (since he could not find the information on his own) pt accepted what phone rep found as next avail (01-19-22 with a 9:15 check in) this again is FYI no call back requested by pt.

## 2021-11-25 ENCOUNTER — Encounter: Payer: Self-pay | Admitting: Neurology

## 2022-01-12 LAB — HM COLONOSCOPY

## 2022-01-19 ENCOUNTER — Ambulatory Visit: Payer: BC Managed Care – PPO | Admitting: Neurology

## 2022-02-08 ENCOUNTER — Encounter: Payer: Self-pay | Admitting: *Deleted

## 2022-02-23 ENCOUNTER — Encounter: Payer: Self-pay | Admitting: *Deleted

## 2022-04-19 ENCOUNTER — Ambulatory Visit: Payer: BC Managed Care – PPO | Admitting: Neurology

## 2022-04-25 ENCOUNTER — Ambulatory Visit: Payer: BC Managed Care – PPO | Admitting: Neurology

## 2022-05-04 ENCOUNTER — Ambulatory Visit: Payer: BC Managed Care – PPO | Admitting: Neurology
